# Patient Record
Sex: Male | Born: 1955 | Race: White | Hispanic: No | Marital: Married | Smoking: Current every day smoker
Health system: Southern US, Community
[De-identification: ages and names within clinical notes are randomized; demographics above are authoritative.]

## PROBLEM LIST (undated history)

## (undated) DIAGNOSIS — F329 Major depressive disorder, single episode, unspecified: Secondary | ICD-10-CM

## (undated) DIAGNOSIS — E119 Type 2 diabetes mellitus without complications: Secondary | ICD-10-CM

## (undated) DIAGNOSIS — G629 Polyneuropathy, unspecified: Secondary | ICD-10-CM

## (undated) DIAGNOSIS — E11621 Type 2 diabetes mellitus with foot ulcer: Secondary | ICD-10-CM

## (undated) DIAGNOSIS — F419 Anxiety disorder, unspecified: Secondary | ICD-10-CM

## (undated) DIAGNOSIS — M545 Low back pain: Secondary | ICD-10-CM

## (undated) DIAGNOSIS — F32A Depression, unspecified: Secondary | ICD-10-CM

## (undated) DIAGNOSIS — G8929 Other chronic pain: Secondary | ICD-10-CM

## (undated) DIAGNOSIS — M549 Dorsalgia, unspecified: Secondary | ICD-10-CM

## (undated) DIAGNOSIS — R3912 Poor urinary stream: Secondary | ICD-10-CM

## (undated) DIAGNOSIS — I10 Essential (primary) hypertension: Secondary | ICD-10-CM

## (undated) DIAGNOSIS — M1990 Unspecified osteoarthritis, unspecified site: Secondary | ICD-10-CM

## (undated) DIAGNOSIS — J449 Chronic obstructive pulmonary disease, unspecified: Secondary | ICD-10-CM

## (undated) DIAGNOSIS — K859 Acute pancreatitis without necrosis or infection, unspecified: Secondary | ICD-10-CM

## (undated) DIAGNOSIS — I739 Peripheral vascular disease, unspecified: Secondary | ICD-10-CM

## (undated) DIAGNOSIS — L97509 Non-pressure chronic ulcer of other part of unspecified foot with unspecified severity: Secondary | ICD-10-CM

## (undated) DIAGNOSIS — Z8709 Personal history of other diseases of the respiratory system: Secondary | ICD-10-CM

## (undated) DIAGNOSIS — G510 Bell's palsy: Secondary | ICD-10-CM

## (undated) DIAGNOSIS — Z8619 Personal history of other infectious and parasitic diseases: Secondary | ICD-10-CM

## (undated) DIAGNOSIS — G4700 Insomnia, unspecified: Secondary | ICD-10-CM

## (undated) DIAGNOSIS — J189 Pneumonia, unspecified organism: Secondary | ICD-10-CM

## (undated) HISTORY — PX: ESOPHAGOGASTRODUODENOSCOPY: SHX1529

## (undated) HISTORY — PX: MULTIPLE TOOTH EXTRACTIONS: SHX2053

## (undated) HISTORY — PX: CATARACT EXTRACTION W/ INTRAOCULAR LENS IMPLANT: SHX1309

## (undated) HISTORY — PX: BACK SURGERY: SHX140

## (undated) HISTORY — PX: TONSILLECTOMY: SUR1361

## (undated) HISTORY — PX: KNEE ARTHROSCOPY: SUR90

## (undated) HISTORY — PX: CHOLECYSTECTOMY: SHX55

---

## 1999-01-24 ENCOUNTER — Encounter: Payer: Self-pay | Admitting: Emergency Medicine

## 1999-01-26 ENCOUNTER — Encounter: Payer: Self-pay | Admitting: Internal Medicine

## 1999-01-29 ENCOUNTER — Encounter: Payer: Self-pay | Admitting: Internal Medicine

## 2000-09-23 ENCOUNTER — Ambulatory Visit (HOSPITAL_COMMUNITY): Admission: RE | Admit: 2000-09-23 | Discharge: 2000-09-23 | Payer: Self-pay | Admitting: *Deleted

## 2000-11-10 ENCOUNTER — Ambulatory Visit (HOSPITAL_BASED_OUTPATIENT_CLINIC_OR_DEPARTMENT_OTHER): Admission: RE | Admit: 2000-11-10 | Discharge: 2000-11-10 | Payer: Self-pay | Admitting: Urology

## 2003-03-06 ENCOUNTER — Ambulatory Visit (HOSPITAL_COMMUNITY): Admission: RE | Admit: 2003-03-06 | Discharge: 2003-03-06 | Payer: Self-pay | Admitting: Orthopedic Surgery

## 2003-03-06 ENCOUNTER — Ambulatory Visit (HOSPITAL_BASED_OUTPATIENT_CLINIC_OR_DEPARTMENT_OTHER): Admission: RE | Admit: 2003-03-06 | Discharge: 2003-03-06 | Payer: Self-pay | Admitting: Orthopedic Surgery

## 2003-06-14 HISTORY — PX: LUMBAR DISC SURGERY: SHX700

## 2003-06-19 ENCOUNTER — Ambulatory Visit (HOSPITAL_COMMUNITY): Admission: RE | Admit: 2003-06-19 | Discharge: 2003-06-20 | Payer: Self-pay | Admitting: Orthopaedic Surgery

## 2003-06-27 ENCOUNTER — Ambulatory Visit (HOSPITAL_COMMUNITY): Admission: RE | Admit: 2003-06-27 | Discharge: 2003-06-27 | Payer: Self-pay | Admitting: Orthopaedic Surgery

## 2003-08-14 HISTORY — PX: MICRODISCECTOMY LUMBAR: SUR864

## 2003-09-08 ENCOUNTER — Observation Stay (HOSPITAL_COMMUNITY): Admission: RE | Admit: 2003-09-08 | Discharge: 2003-09-09 | Payer: Self-pay | Admitting: Orthopaedic Surgery

## 2003-10-10 ENCOUNTER — Ambulatory Visit (HOSPITAL_COMMUNITY): Admission: RE | Admit: 2003-10-10 | Discharge: 2003-10-10 | Payer: Self-pay | Admitting: Orthopaedic Surgery

## 2004-08-15 ENCOUNTER — Ambulatory Visit: Payer: Self-pay | Admitting: Physical Medicine & Rehabilitation

## 2004-08-15 ENCOUNTER — Encounter
Admission: RE | Admit: 2004-08-15 | Discharge: 2004-11-13 | Payer: Self-pay | Admitting: Physical Medicine & Rehabilitation

## 2004-09-06 ENCOUNTER — Ambulatory Visit (HOSPITAL_COMMUNITY): Admission: RE | Admit: 2004-09-06 | Discharge: 2004-09-06 | Payer: Self-pay | Admitting: Orthopaedic Surgery

## 2004-10-15 ENCOUNTER — Ambulatory Visit: Payer: Self-pay | Admitting: Physical Medicine & Rehabilitation

## 2004-11-15 ENCOUNTER — Encounter
Admission: RE | Admit: 2004-11-15 | Discharge: 2005-02-13 | Payer: Self-pay | Admitting: Physical Medicine & Rehabilitation

## 2004-11-15 ENCOUNTER — Ambulatory Visit: Payer: Self-pay | Admitting: Physical Medicine & Rehabilitation

## 2005-01-16 ENCOUNTER — Ambulatory Visit: Payer: Self-pay | Admitting: Physical Medicine & Rehabilitation

## 2005-03-12 ENCOUNTER — Encounter
Admission: RE | Admit: 2005-03-12 | Discharge: 2005-06-10 | Payer: Self-pay | Admitting: Physical Medicine & Rehabilitation

## 2005-03-13 ENCOUNTER — Ambulatory Visit: Payer: Self-pay | Admitting: Physical Medicine & Rehabilitation

## 2005-04-23 ENCOUNTER — Ambulatory Visit: Payer: Self-pay | Admitting: Physical Medicine & Rehabilitation

## 2005-06-19 ENCOUNTER — Ambulatory Visit: Payer: Self-pay | Admitting: Physical Medicine & Rehabilitation

## 2005-06-19 ENCOUNTER — Encounter
Admission: RE | Admit: 2005-06-19 | Discharge: 2005-09-17 | Payer: Self-pay | Admitting: Physical Medicine & Rehabilitation

## 2005-08-08 ENCOUNTER — Ambulatory Visit: Payer: Self-pay | Admitting: Physical Medicine & Rehabilitation

## 2005-08-08 ENCOUNTER — Encounter
Admission: RE | Admit: 2005-08-08 | Discharge: 2005-11-06 | Payer: Self-pay | Admitting: Physical Medicine & Rehabilitation

## 2005-10-08 ENCOUNTER — Ambulatory Visit: Payer: Self-pay | Admitting: Physical Medicine & Rehabilitation

## 2005-11-27 ENCOUNTER — Encounter
Admission: RE | Admit: 2005-11-27 | Discharge: 2006-02-25 | Payer: Self-pay | Admitting: Physical Medicine & Rehabilitation

## 2005-11-27 ENCOUNTER — Ambulatory Visit: Payer: Self-pay | Admitting: Physical Medicine & Rehabilitation

## 2006-01-21 ENCOUNTER — Ambulatory Visit: Payer: Self-pay | Admitting: Physical Medicine & Rehabilitation

## 2006-03-18 ENCOUNTER — Encounter
Admission: RE | Admit: 2006-03-18 | Discharge: 2006-06-16 | Payer: Self-pay | Admitting: Physical Medicine & Rehabilitation

## 2006-05-07 ENCOUNTER — Ambulatory Visit: Payer: Self-pay | Admitting: Physical Medicine & Rehabilitation

## 2006-07-29 ENCOUNTER — Encounter
Admission: RE | Admit: 2006-07-29 | Discharge: 2006-07-30 | Payer: Self-pay | Admitting: Physical Medicine & Rehabilitation

## 2006-07-29 ENCOUNTER — Ambulatory Visit: Payer: Self-pay | Admitting: Physical Medicine & Rehabilitation

## 2006-10-21 ENCOUNTER — Ambulatory Visit: Payer: Self-pay | Admitting: Physical Medicine & Rehabilitation

## 2006-10-21 ENCOUNTER — Encounter
Admission: RE | Admit: 2006-10-21 | Discharge: 2006-10-22 | Payer: Self-pay | Admitting: Physical Medicine & Rehabilitation

## 2007-01-12 ENCOUNTER — Encounter
Admission: RE | Admit: 2007-01-12 | Discharge: 2007-03-25 | Payer: Self-pay | Admitting: Physical Medicine & Rehabilitation

## 2007-01-12 ENCOUNTER — Ambulatory Visit: Payer: Self-pay | Admitting: Physical Medicine & Rehabilitation

## 2007-04-07 ENCOUNTER — Ambulatory Visit: Payer: Self-pay | Admitting: Physical Medicine & Rehabilitation

## 2007-04-07 ENCOUNTER — Encounter
Admission: RE | Admit: 2007-04-07 | Discharge: 2007-04-09 | Payer: Self-pay | Admitting: Physical Medicine & Rehabilitation

## 2007-06-28 ENCOUNTER — Encounter
Admission: RE | Admit: 2007-06-28 | Discharge: 2007-07-22 | Payer: Self-pay | Admitting: Physical Medicine & Rehabilitation

## 2007-07-22 ENCOUNTER — Ambulatory Visit: Payer: Self-pay | Admitting: Physical Medicine & Rehabilitation

## 2007-12-22 ENCOUNTER — Encounter
Admission: RE | Admit: 2007-12-22 | Discharge: 2007-12-29 | Payer: Self-pay | Admitting: Physical Medicine & Rehabilitation

## 2007-12-29 ENCOUNTER — Ambulatory Visit: Payer: Self-pay | Admitting: Physical Medicine & Rehabilitation

## 2010-05-28 NOTE — Assessment & Plan Note (Signed)
Eddie Rasmussen returns to the clinic today for followup evaluation.  Eddie Rasmussen reports  that Eddie Rasmussen is doing well overall.  Eddie Rasmussen has adjusted his medicines, so that  Eddie Rasmussen is due for refill on the morphine sulfate and the hydrocodone today.  Eddie Rasmussen has sufficient supply of Soma and Neurontin.   REVIEW OF SYSTEMS:  Noncontributory.   MEDICATIONS:  1. Amitriptyline 100 mg p.o. nightly p.r.n.  2. Soma 350 mg t.i.d. p.r.n.  3. Lopid 600 mg b.i.d.  4. Hydrocodone 10/325 one tablet q.i.d. p.r.n.  5. Xanax 1 mg q.i.d.  6. Atenolol 25 mg two tablets daily.  7. Neurontin 300 mg two tablets t.i.d.  8. Levemir 30 units subcu q.12 h.  9. NovoLog insulin 10 units subcu b.i.d.  10.Morphine Sulfate Continuous Release 15 mg q.i.d.  11.Benadryl p.r.n.   PHYSICAL EXAMINATION:  GENERAL:  Well-appearing, large adult male in  mild-to-no acute discomfort.  VITAL SIGNS:  Blood pressure 149/84 with a pulse of 87, respiratory rate  18, and O2 saturation 96% on room air.  EXTREMITIES:  Eddie Rasmussen has 5/5 strength throughout.  Eddie Rasmussen ambulates without any  assistive device.   IMPRESSION:  1. Moderate-to-severe lumbar spondylosis/degenerative disk disease.  2. 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   In the office today, we did refill the patient's hydrocodone and  morphine sulfate continuous release each as of today.  No refill on the  Neurontin or Soma are necessary.  We will plan on seeing the patient in  followup in approximately 3-4 months' time with refills prior to that  appointment as necessary.           ______________________________  Ellwood Dense, M.D.     DC/MedQ  D:  07/22/2007 11:45:44  T:  07/23/2007 06:01:05  Job #:  [id number]

## 2010-05-28 NOTE — Assessment & Plan Note (Signed)
Mr. Centrella returns to the clinic today for followup evaluation.   He reports that is trying to remain as active as possible and has had  some weight loss intentionally.  He reports that his blood sugar still  remains in the 250-300 range despite using Levemir insulin and Humalog  insulin.  He does need a refill on his morphine sulfate and his  hydrocodone.  He generally uses the morphine sulfate 3-4 tablets  per  day and the hydrocodone approximately 4 per day.  He has a sufficient  supply of Neurontin and Soma at this time.   REVIEW OF SYSTEMS:  Positive for high blood sugar and poor appetite.   MEDICATIONS:  1. Amitriptyline 100 mg p.o. nightly p.r.n.  2. Soma 350 mg t.i.d. p.r.n.  3. Lopid 600 mg b.i.d.  4. Hydrocodone 10/325 one tablet q.i.d. p.r.n.  5. Xanax 1 mg q.i.d.  6. Atenolol 25 mg two tablets daily.  7. Neurontin 300 mg two tablets t.i.d.  8. Levemir 30 units subcutaneous q.12 h.  9. NovoLog 10 units subcutaneous  b.i.d.  10.Morphine sulfate continuous release 15 mg one tablet q.i.d. p.r.n.  11.Benadryl p.r.n.   PHYSICAL EXAMINATION:  GENERAL:  A well-appearing large adult male in  mild to no acute discomfort.  VITAL SIGNS:  Blood pressure 128/91 with pulse of 102, respiratory rate  18, and O2 saturation 99% on room air.  MUSCULOSKELETAL:  He has 5/5 strength throughout.  He ambulates without  any assistive device.   IMPRESSION:  1. Moderate to severe lumbar spondylosis/degenerative disk disease.  2. 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   In the office today we did refill the patient's morphine sulfate as of  April 14, 2007 and his hydrocodone as of April 21, 2007.  We will plan to  see the patient in followup in approximately 3 months' time.  I have  asked him to make sure he brings in prescription bottles, so we can get  accurate counts on his medicine and accurate dates for refills.           ______________________________  Ellwood Dense,  M.D.     DC/MedQ  D:  04/09/2007 13:27:01  T:  04/09/2007 14:17:00  Job #:  [id number]

## 2010-05-28 NOTE — Assessment & Plan Note (Signed)
[name] returns to clinic today for followup evaluation.  He reports  that overall he is doing well.  He continues to get benefit from a  combination of the Soma, hydrocodone, morphine sulfate, and Neurontin.  He has a sufficient supply of all but the morphine sulfate at this point  and he needs a refill on that in the office today.   The patient reports that his recent hemoglobin A1c was elevated at 11.0.  He reports that he was discontinued on his 70/30 insulin and started on  Levemir at 20 units q.a.m. and Humalog with his evening meal.  He is due  to follow up with his diabetic physician January 28, 2007.  He was also  recently started on Glucotrol daily.   REVIEW OF SYSTEMS:  Noncontributory.   MEDICATIONS:  1. Amitriptyline 100 mg p.o. q.h.s. p.r.n.  2. Soma 350 mg t.i.d. (3 to 4 per day).  3. Lopid 600 mg b.i.d.  4. Hydrocodone 10/325 one tablet 3 to 4 times per day p.r.n.  5. Xanax 1 mg q.i.d.  6. Atenolol 25 mg 2 tablets daily.  7. Neurontin 300 mg 2 tablets t.i.d.  8. Morphine sulfate continuous release 50 mg   Dictation ended at this point.           ______________________________  Ellwood Dense, M.D.     DC/MedQ  D:  01/15/2007 14:12:24  T:  01/15/2007 15:41:23  Job #:  [id number]

## 2010-05-28 NOTE — Assessment & Plan Note (Signed)
Mr. Reaume returns for followup evaluation. He continues to get reasonable  relief from his combination of his Soma, hydrocodone, morphine sulfate  continuous release and Neurontin. He reports that his HbA1c was recently  elevated at 11. He was discontinued from his 70/30 insulin and started  on Levemir 20 units subcu q.a.m. and Humalog sliding scale with dinner.  He reports that he is due to follow up with his diabetes specialist  January 28, 2007. The patient does need a refill on his morphine sulfate  in the office today, but has a sufficient supply of Soma, hydrocodone  and Neurontin.   REVIEW OF SYSTEMS:  Noncontributory.   MEDICATIONS:  1. Amitriptyline 100 mg p.o. at bedtime p.r.n.  2. Soma 350 mg t.i.d. p.r.n.  3. Lopid 600 mg b.i.d.  4. Hydrocodone 10/325 one tablet 3-4 times per day p.r.n.  5. Xanax 1 mg q.i.d.  6. Atenolol 25 mg two tablets every day.  7. Neurontin 300 mg two tablets t.i.d.  8. Levemir 20 units subcu q.a.m.  9. Humalog insulin sliding scale with dinner.  10.Morphine sulfate continuous release 15 mg one tablet q.i.d. p.r.n.      (3-4 per day).  11.Benadryl p.r.n.   PHYSICAL EXAMINATION:  Well-appearing moderately overweight adult large  male in mild to no acute discomfort. Blood pressure 149/89 with a pulse  of 99, respiratory rate 18, O2 saturation 95% on room air. The patient  has 5/5 strength throughout. He ambulates without any assistive device.   IMPRESSION:  1. Moderate-to-severe lumbar spondylosis/degenerative disk disease.  2. 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   In the office today we did refill the patient's morphine sulfate  continuous release one tablet q.i.d. p.r.n. a total of 120 at 15 mg  strength. No refill on the other medicines is necessary at this time. We  will plan on seeing the patient in follow up in approximately 3 months  time with refills as necessary prior to that appointment.     ______________________________  Ellwood Dense, M.D.     DC/MedQ  D:  01/15/2007 14:38:08  T:  01/15/2007 17:07:04  Job #:  [id number]

## 2010-05-28 NOTE — Assessment & Plan Note (Signed)
Mr. Jaime returns to the clinic today for follow-up evaluation.  He  reports that he is getting good relief from his morphine sulfate along  with his Neurontin and hydrocodone.  He does need a refill on his  hydrocodone but has a sufficient supply of morphine and Neurontin.  He  generally uses his morphine as little as three times a day with only  rare use of the medicine four times per day.   The patient has been more active recently.  He has moved out of a prior  trailer that he was living in and has moved into a new one that is  handicap-accessible.  He has been doing some extra work related to that  move.  He reports that his hemoglobin A1C was measured at 11.  He  reports that he has had adjustments in his blood sugar medicines and has  had an improvement with recent readings of CBGs of 130-150.   MEDICATIONS:  1. Amitriptyline 100 mg nightly p.r.n.  2. Soma 350 mg t.i.d. p.r.n.  3. Lopid 600 mg b.i.d.  4. Hydrocodone 10/325 1 tablet q.i.d. p.r.n.  5. Xanax 1 mg q.i.d.  6. Atenolol 25 mg 2 tablets daily.  7. Neurontin 300 mg 2 tablets t.i.d.  8. Insulin 70/30 110 units q.a.m. and 90 units q.p.m.  9. Morphine sulfate continuous release 50 mg q.i.d. (3-4 per day).  10.Benadryl p.r.n.   REVIEW OF SYSTEMS:  Noncontributory.   PHYSICAL EXAMINATION:  GENERAL:  A well-appearing, large, well-nourished  adult male in mild to no acute discomfort.  VITAL SIGNS:  Blood pressure 135/96, pulse 95, respiratory rate 18, O2  saturation 97% on room air.  NEUROMUSCULAR:  He has 5/5 strength throughout.   IMPRESSION:  1. Moderate-to-severe lumbar spondylosis/degenerative disk disease.  2. 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   In the office today, we did refill the patient's hydrocodone as of July  21.  No other refills are necessary at this time.  We will plan on  seeing him in followup in three months time with refills prior to that  appointment as necessary.     ______________________________  Ellwood Dense, M.D.     DC/MedQ  D:  07/30/2006 10:02:02  T:  07/30/2006 19:36:16  Job #:  [id number]

## 2010-05-28 NOTE — Assessment & Plan Note (Signed)
Eddie Rasmussen returns to clinic today for followup evaluation.  He reports  that he is doing well on a combination of his Soma, hydrocodone, and  morphine.  He does need a refill on each of those at the beginning of  the month.  He reports that he has lot of stress in his family lately  between his sister-in-law and his mother.  He is trying to do as best as  he can to be the mediator.   REVIEW OF SYSTEMS:  Positive for skin rash, weight gain, fever and  chills, high blood sugar, urinary retention, and limb swelling.   MEDICATIONS:  1. Amitriptyline 100 mg p.o. at bedtime, p.r.n.  2. Soma 350 mg t.i.d. p.r.n.  3. Lopid 600 mg b.i.d.  4. Hydrocodone 10/325, one tablet q.i.d. p.r.n.  5. Xanax 1 mg q.i.d.  6. Atenolol 25 mg 2 tablets daily.  7. Neurontin 300 mg 2 tablets t.i.d.  8. Levemir 13 units subcu q.12 h.  9. NovoLog insulin 10 units subcu b.i.d.  10.Morphine sulfate continuous release 15 mg q.i.d.  11.Benadryl p.r.n.   PHYSICAL EXAMINATION:  GENERAL:  Well-appearing large adult male in mild-  to-no acute discomfort.  VITAL SIGNS:  Blood pressure was 168/103 in his left upper extremity and  151/89 in his right upper extremity.  Pulse is 100 with respiratory rate  of 18 and O2 saturation 95% on room air.  MUSCULOSKELETAL:  He ambulates without any assistive device and has 5/5  strength throughout.  Lumbar range of motion was decreased in flexion  and extension.   IMPRESSION:  1. Moderate-to-severe lumbar spondylosis/degenerative disk disease.  2. 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   In the office today, we did refill the patient's Soma, hydrocodone, and  morphine each as of January 15, 2008.  We will plan on seeing the patient  in followup in this office in approximately 4 months time with refills  prior to that appointment.  He continues to get good analgesic affect  without signs of diversion or significant side effects.     ______________________________  Ellwood Dense, M.D.     DC/MedQ  D:  12/29/2007 10:53:36  T:  12/30/2007 16:10:96  Job #:  [id number]

## 2010-05-28 NOTE — Assessment & Plan Note (Signed)
HISTORY:  [name] returns to clinic today for followup evaluation.  He  reports that he is doing well overall.  Continues to get good relief  from a combination of his morphine, Neurontin, hydrocodone and Soma with  amitriptyline as needed.  He does need refill on the hydrocodone and  Soma in the office today.  The patient reports that he is more active  recently.  He has changed local homes recently and that took some extra  effort to move all his possessions.   REVIEW OF SYSTEMS:  Noncontributory.   MEDICATIONS:  1. Amitriptyline 100 mg p.o. nightly p.r.n.  2. Soma 350 mg t.i.d., p.r.n.  3. Lopid 600 mg b.i.d.  4. Hydrocodone 10/325 one tablet 4 times daily p.r.n.  5. Xanax 1 mg 4 times daily.  6. Atenolol 25 mg 2 tablets daily.  7. Neurontin 300 mg 2 tablets t.i.d.  8. Insulin 70/30 110 units q.a.m. and 90 units q.p.m.  9. Morphine sulfate continuous release 50 mg 4 times daily (3-4 per      day).  10.Benadryl p.r.n.   PHYSICAL EXAMINATION:  GENERAL:  Well-appearing, large, well-nourished  adult male in mild to no acute discomfort.  VITAL SIGNS:  Blood pressure is 142/84 with pulse of 92, respiratory  rate 18 and O2 saturation 96% on room air.  NEUROMUSCULAR:  He has 5/5 strength throughout.  He ambulates without  any assistive device.  Lumbar range of motion was decreased in all  planes.   IMPRESSION:  1. Moderate to severe lumbar spondylosis/degenerative disk disease.  2. 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   PLAN:  In the office today we did refill the patient's Soma and  hydrocodone as of 10/30/2006.  No other refills are necessary at this  time.  Will plan on seeing him in followup in approximately 3 months'  time with refills prior to that appointment as necessary.           ______________________________  Ellwood Dense, M.D.     DC/MedQ  D:  10/22/2006 10:30:15  T:  10/22/2006 14:37:28  Job #:  [id number]

## 2010-05-31 NOTE — Group Therapy Note (Signed)
Unfortunately, I had just started a dictation and cut it off early by  pushing the wrong button.  This is a new office note for August 19, 2004, for  Eddie Rasmussen, his medical record number is [id number], and if you could please  pick it up from where I left off.   The patient reports that in September 2004, he underwent a right knee  meniscectomy by Dr. Marlinda Mike.  Unfortunately, the patient reports that that  surgery gave him no relief.   January 11, 2003, the patient was evaluated by Dr. Ophelia Charter, a local  orthopedist.  The MRI scan was reviewed.  Dr. Ophelia Charter felt the patient was an  unfavorable surgical candidate.  He recommended epidural steroid injections.   January 15, 2003, the patient was started with Neurontin along with Vicodin  and Soma for pain management.   January 2005, the patient underwent his first epidural steroid injection,  which he reports helped a great deal.   March 06, 2003, the patient saw Dr. Turner Daniels and underwent arthroscopy of  the right knee for partial medial meniscectomy and excision of focal grade 4  chondromalacia.  He also had a medial tibial condyle along with some minimal  chondromalacia removed from the patella.  The patient reports that that  surgery also gave him no relief in terms of his right knee pain.   The patient underwent his second epidural steroid injection February 2005  and reports that that also gave him some relief.   March 29, 2003, the patient saw Dr. Turner Daniels in follow-up. His pain was much  better in the right knee.  He was allowed light-duty sedentary work at that  time.   March 23, 2003, the patient underwent his third epidural steroid injection,  which reportedly gave him some relief.   March 2005, the patient was referred for therapy.   April 22, 2003, the patient underwent an MRI scan of his lumbar spine, which  showed moderately severe central canal impingement and biforaminal stenosis  at L3-4, related to central protrusion  superimposed upon advanced lumbar  spondylosis.  There was a central L4-5 disk extrusion with extension toward  the right L5 nerve root, which is superimposed upon advanced spondylosis  resulting in moderate to severe central canal impingement and advanced  biforaminal stenosis.  There was distortion of the thecal sac centrally and  at the descending right L5 nerve root.  There was broad-based left  paracentral and foraminal disk protrusion and a spur at L5-S1.  This  flattened the thecal sac and posteriorly displaced the left S1 nerve root.  A more generalized spondylosis at this level contributed to advanced  biforaminal stenosis.   May 03, 2003, EMG and nerve conduction studies reportedly were consistent  with L5 radiculopathy.   On May 17, 2003, Dr. Ophelia Charter discussed microdiskectomy.   June 19, 2003, the patient underwent L4-5 microdiskectomy performed by Dr.  Ophelia Charter.  He reports that he got no better but actually got worse after that  surgery.   June 2005, the patient was involved in outpatient physical therapy.   September 05, 2003, Dr. Ophelia Charter saw the patient in follow-up with persistent  right lower extremity pain.  He planned a bilateral L4-5 microdiskectomy and  decompression after follow-up MRI scan, which showed moderately large  central disk protrusion.   September 11, 2003, the patient underwent repeat microdiskectomy for recurrent  herniated nucleus pulposus bilaterally at L4-5.  The patient reports that  this helped him with his leg  pain for approximately one month's time.   The patient was involved in outpatient physical therapy between October 2005  and the end of October 2005.  He withdrew from therapy after approximately  two visits.   The patient reports that he subsequently was tried on OxyContin and  oxycodone but that made him combative and argumentative and gave him  recurrent nightmares.  That subsequently was stopped.   October 31, 2003, the patient saw Dr.  Ophelia Charter and was started on methadone 10  mg b.i.d.  He was also started on Zoloft at that time.   November 15, 2003, the patient underwent a right knee injection with Marcaine  and Xylocaine performed by Dr. Turner Daniels.   November 25, 2003, an MRI scan of his right knee showed some full-thickness  cartilage along the innermost medial compartment.  A discrete meniscal tear  could not be confirmed.   December 20, 2003, the patient saw Dr. Ophelia Charter in follow-up and methadone was  continued at 10 mg q.12h.  He was allowed light-duty work, four hours per  day with no lifting greater than 20 pounds and no ladder climbing.   February 06, 2004, the patient saw Dr. Turner Daniels.  At that time Dr. Turner Daniels felt  that his right knee was at maximal medial improvement and rated him as 20%  impairment for the right lower extremity.  He was allowed no squatting or  stooping and no lifting greater than 20 pounds.  Follow-up was on an as-  needed basis.   February 02, 2004, the patient saw Dr. Ophelia Charter and rated him at 18% for his  lumbar spine impairment.  He was judged to be at maximal medical improvement  at that time, and follow-up was on an as-needed basis.   On Jun 05, 2004, the patient saw Dr. Ophelia Charter and was prescribed Vicodin along  with Neurontin and Soma.  Some of those medication were being switched over  to Dr. Eloise Harman, his primary care physician.   Jun 05, 2004, the patient saw Dr. Turner Daniels in follow-up.  At that time he was  maintained on a 20-pound lifting limit with no stooping, bending, squatting  or crawling, kneeling or climbing.  He was asked to try Celebrex.   The patient reports that he had seen Dr. Noel Gerold, a local orthopedist, for at  least one visit with an MRI scan having been completed.  I do not have  records regarding those visits nor from the MRI scan.  Dr. Noel Gerold suggested  that a fusion of his lumbar spine may be appropriate, but he wished to have the right knee problem completely evaluated by Dr.  Turner Daniels before any surgery  on his back would be done.  The patient plans to follow up with Dr. Noel Gerold  and feels that surgery is still a possibility, although the patient is  reluctant given the poor success that he has had with the four surgeries,  two involving his knee and two involving his back, thus far.   Presently the patient complains of right knee pain which is most severe on  the medial surface of his right knee along with the lower patella  anteriorly.  He reports that he also has right lumbar pain radiating to his  right hip and down the right thigh, stopping at his right ankle.  He reports  some milder pain in his left lumbar region without radicular symptoms,  although it does move into his buttocks.  He reports that a recent  hemoglobin  A1c came back at 7.3.  He also complains of some right upper arm  pain with decreased range of motion but reports that that was denied by  Workers Comp to date.   MEDICATIONS:  1. Amitriptyline 50 mg one tablet q.h.s.  2. Soma 350 mg one tablet q.i.d.  3. Zoloft 50 mg daily.  4. Lopid 600 mg one tablet twice a day.  5. Hydrocodone 5/500 mg two tablets three times a day.  6. Xanax 1 mg one tablet q.i.d.  7. Atenolol 25 mg one tablet daily.  8. Neurontin 300 mg two tablets three times per day.  9. Insulin 70/30 110 q.a.m. and 100 units q.p.m.   At the present time, the patient reports that he was getting better relief  from his methadone than he is from his present Vicodin.  He is not sure how  much benefit he gets from the amitriptyline, Soma or Neurontin medication at  this time.   FAMILY HISTORY:  Positive for diabetes mellitus and heart disease.   ALLERGIES:  Intolerance to NSAIDs, prednisone and codeine.   SOCIAL HISTORY:  The patient was married but is separated for the past 10  days.  He smokes 1-1/2 packs of cigarettes per day for 30 years.  He denies  alcohol intake.  He previously worked as a Education administrator.  He is on Workers Comp   payments at the present time.  He lives on his own at this point with his  mother living close by.   PAST MEDICAL HISTORY:  1. History of knee surgery x2 by Dr. Marlinda Mike and Dr. Turner Daniels as noted.  2. History of back surgery x2 by Dr. Ophelia Charter.  3. Insulin-dependent diabetes mellitus since 1996.  4. Prior cholecystectomy in 2001.  5. Adult circumcision.  6. History of pancreatitis/pneumonia.  7. Bronchitis.  8. Arthritis.   PHYSICAL EXAMINATION:  GENERAL:  An ill-kempt, large adult male in moderate  acute discomfort.  He is pleasant and cooperative throughout the  examination.  VITAL SIGNS:  Blood pressure 142/97 with a pulse of 97, respiratory rate 16,  and O2 saturation 97% on room air.  NEUROLOGIC/MUSCULOSKELETAL:  The patient was able to ambulate without any  assistive device in a slow manner with obvious pain.  He was unable to toe-  walk and heel-walk on the right side.  Upper extremity range of motion was normal on the left side but decreased in terms of shoulder flexion and  abduction on the right.  Elbow range of motion was normal bilaterally, as  was distal range of motion of the wrists and fingers.  Upper extremity exam  generally showed 5/5 strength throughout the left upper extremity and 5-/5  strength throughout the proximal right upper extremity and otherwise 5/5  strength throughout the right upper extremity distally.  Bulk and tone were  normal, and reflexes were 2+ and symmetrical.  Sensation was intact to light  touch throughout the bilateral upper extremities.  Lower extremity exam  showed at least 4+ to 5-/5 strength in hip flexion and knee extension along  with ankle dorsiflexion bilaterally.  Reflexes reveal 1+ at the bilateral  knees and ankles, and sensation was intact to light touch throughout the  bilateral lower extremities with the exception of his right lateral calf and  into the fourth and fifth digits of his right foot.  Lumbar range of motion  showed  significant decreased mobility in the standing position.  In the  supine position, hip range of motion  was within normal limits and straight  leg raise was mildly positive at only 20-30 degrees.   IMPRESSION:  1. Moderate to severe lumbar spondylosis/degenerative disk disease.  2. 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   At the present time, the patient has not responded to a surgery  arthroscopically done of his right knee x2, nor to lumbar microdiskectomy  done x2.  At this point there is no planned surgery, but there is still a  possibility though the likelihood is not as good judging from Dr. Ophelia Charter' and  Dr. Wadie Lessen notes.  It is not clear exactly if Dr. Noel Gerold feels strongly  about the back surgery, although he has apparently recommended a fusion to  the patient.  The patient is somewhat reluctant given his poor results in  the past.   In any event, at this point the Vicodin that he is using is not giving him  adequate pain relief.  Instead, will resume the methadone at 10 mg one  tablet q.12h.  I have told him that I will adjust that dose as necessary  when I see him in follow-up in approximately three to four weeks' time.  I  have recommended he continue the amitriptyline, Soma and Neurontin  medications, and I will be refilling those as necessary in the future.  We  did give him a new prescription for methadone 10 mg one tablet q.12h., to  start as of August 21, 2004, since he has problems with transportation.  Will  also start giving him the amitriptyline at 50 mg one tablet p.o. q.h.s.  He  will call in for refills on the Neurontin and Soma as those run out.  Will  plan on seeing the patient in follow-up in this office in approximately  three to four weeks' time.       DC/MedQ  D:  08/19/2004 14:39:23  T:  08/20/2004 08:10:32  Job #:  [id number]   cc:   Mendel Corning, RN, BSN, Case Mgr.  [employer] research (medical), [employer].  [address], [address] [place], [place] [address]

## 2010-05-31 NOTE — Op Note (Signed)
NAME:  Eddie Rasmussen, Eddie Rasmussen                             ACCOUNT NO.:  [id number]   MEDICAL RECORD NO.:  [id number]                   PATIENT TYPE:  INP   LOCATION:  5030                                 FACILITY:  [facility]   PHYSICIAN:  Mark C. Ophelia Charter, M.D.                 DATE OF BIRTH:  30-Mar-1955   DATE OF PROCEDURE:  09/08/2003  DATE OF DISCHARGE:  09/09/2003                                 OPERATIVE REPORT   PREOPERATIVE DIAGNOSES:  Recurrent L4-5 herniated nucleus pulposus, left L5-  S1 herniated nucleus pulposus.   POSTOPERATIVE DIAGNOSES:  Recurrent L4-5 herniated nucleus pulposus, left L5-  S1 herniated nucleus pulposus.   PROCEDURE:  Repeat bilateral L4-5 microdiskectomy, removal of herniated  nucleus pulposus, left L5-S1 microdiskectomy.   SURGEON:  Mark C. Ophelia Charter, M.D.   ASSISTANT:  F.R.N.A.   ANESTHESIA:  GOT.   ESTIMATED BLOOD LOSS:  100 cc.   This 55 year old male had a recurrent disk at L4-5 and the plan was for  bilateral approach.  He had had only right leg pain preoperatively until he  was seen in the holding area and stated in the last 2-3 days, he has been  having significant left leg pain which was a new finding.  He does have a  known L5-S1 HNP and the concern was that his leg pain on the left could be  due to the HNP on either L4-5 or L5-S1.  It was decided to take a look at  the L5-S1 disk as well intraoperatively.   After induction of general anesthesia, preoperative antibiotic prophylaxis,  the patient was placed in the kneeling position, careful padding and  positioning.  The back was prepped with Duraprep.  The area was scored with  towels.  Betadine and Vi-drape was applied and laminectomy sheets and  drapes.  The incision was made, opening the old incision, extending it  distally.  Subperiosteal dissection down the lamina and a Kocher clamp was  placed, taking the x-rays showing that it was just off from the planned L4-5  level.  The laminotomy on the right  was identified at L4-5.  This was  enlarged, removing more normal bone, going up normal dura proximally and  then taking a top third of the right L5 lamina, performing a laminotomy at  L5, down a normal dura.  With normal dura above and below, there was  extensive deep fibrous scar tissue present in the lateral gutter.  With  picks, chunks of ligament were removed and scar tissue with the operative  microscope was gently dissected off using a combination of the black nerve  hook, the titanium microdissection curet's, 15 scalpel blade.  The dural  separator was used to separate the plane.  In one small area, there was a  small bubble.  No true dural leak and this was approximately on the right  side well above the  disk.  The patty was used to protect it.  Valsalva was  checked.  There was no spinal fluid leak and the bubble was 2 mm in size.  The nerve root was gently teased off the large disk and the annulus was  incised with a 15 scalpel blade.  Multiple chunks of disk material were  removed continuing until the foramina was enlarged.  Disk herniation was  primarily right at the level of the disk.  It was extremely large and  multiple large chunks were removed from the midline.  At this point, I  switched sides to the opposite side and then performed a microdissection on  the left side at L4-5, removing additional large chunks of disk on the left  side, freeing up the nerve root, performing a foraminotomy and removing bone  out to the side, even with the level of the pedicle.  Passes were made until  a hockey stick could be placed to enter the dura, visualizing the opposite  side, passed the entry of the dura.  A 180 degree sweep showed no area of  compression and all chunks of disk were removed at 4-5.  Next, the L5-S1  disk space was exposed, incising the ligamentum, removing chunks of disk  laterally, mobilizing the nerve root.  There was a firm portion of the disk  that was partially  calcified.  The area was excised and immediately  underneath it was several large fragments which were just below the  posterior longitudinal ligament and appeared fresh.  There were no free  fragments present.  The foramina was enlarged and once chunks of disk had  been removed, an Epstein curet was used to push down and remove some  additional degenerative chunks.  The nerve root was completely freed.  The  foramina was checked.  Palpation at all sites of the nerve root.  Irrigation  with saline solution.  Above the L5 and L4-5 level on both sides, Valsalva  repeated.  Re-inspection of the dura and no spinal fluid leak.  Passes were  made with the hockey stick again around the nerve root above and below on  the right as well as the left around the L5 nerve root on the left and the  L4 nerve root, right and left.  Irrigation with saline solution and closure  of the deep fascia with 0 Vicryl, 2-0 Vicryl and the subcutaneous tissue.  Marcaine infiltration, skin and staple closure.  Postoperative dressing.                                               Mark C. Ophelia Charter, M.D.    MCY/MEDQ  D:  09/08/2003  T:  09/10/2003  Job:  [id number]

## 2012-03-08 ENCOUNTER — Encounter (HOSPITAL_BASED_OUTPATIENT_CLINIC_OR_DEPARTMENT_OTHER): Payer: Self-pay

## 2013-05-25 ENCOUNTER — Inpatient Hospital Stay (HOSPITAL_COMMUNITY)
Admission: AD | Admit: 2013-05-25 | Discharge: 2013-05-29 | DRG: 241 | Disposition: A | Discharge status: Home / Self Care | Payer: Medicare Other | Source: Ambulatory Visit | Attending: Internal Medicine | Admitting: Internal Medicine

## 2013-05-25 ENCOUNTER — Encounter (HOSPITAL_COMMUNITY): Payer: Self-pay | Admitting: Internal Medicine

## 2013-05-25 ENCOUNTER — Inpatient Hospital Stay (HOSPITAL_COMMUNITY): Payer: Medicare Other

## 2013-05-25 DIAGNOSIS — L02619 Cutaneous abscess of unspecified foot: Secondary | ICD-10-CM | POA: Diagnosis present

## 2013-05-25 DIAGNOSIS — Z720 Tobacco use: Secondary | ICD-10-CM | POA: Diagnosis present

## 2013-05-25 DIAGNOSIS — F17200 Nicotine dependence, unspecified, uncomplicated: Secondary | ICD-10-CM | POA: Diagnosis present

## 2013-05-25 DIAGNOSIS — E1169 Type 2 diabetes mellitus with other specified complication: Secondary | ICD-10-CM

## 2013-05-25 DIAGNOSIS — M129 Arthropathy, unspecified: Secondary | ICD-10-CM | POA: Diagnosis present

## 2013-05-25 DIAGNOSIS — M9080 Osteopathy in diseases classified elsewhere, unspecified site: Secondary | ICD-10-CM | POA: Diagnosis present

## 2013-05-25 DIAGNOSIS — IMO0002 Reserved for concepts with insufficient information to code with codable children: Secondary | ICD-10-CM | POA: Diagnosis present

## 2013-05-25 DIAGNOSIS — Z9119 Patient's noncompliance with other medical treatment and regimen: Secondary | ICD-10-CM

## 2013-05-25 DIAGNOSIS — F329 Major depressive disorder, single episode, unspecified: Secondary | ICD-10-CM | POA: Diagnosis present

## 2013-05-25 DIAGNOSIS — J449 Chronic obstructive pulmonary disease, unspecified: Secondary | ICD-10-CM | POA: Diagnosis present

## 2013-05-25 DIAGNOSIS — G8929 Other chronic pain: Secondary | ICD-10-CM | POA: Diagnosis present

## 2013-05-25 DIAGNOSIS — M545 Low back pain, unspecified: Secondary | ICD-10-CM | POA: Diagnosis present

## 2013-05-25 DIAGNOSIS — E1149 Type 2 diabetes mellitus with other diabetic neurological complication: Secondary | ICD-10-CM | POA: Diagnosis present

## 2013-05-25 DIAGNOSIS — F411 Generalized anxiety disorder: Secondary | ICD-10-CM | POA: Diagnosis present

## 2013-05-25 DIAGNOSIS — I739 Peripheral vascular disease, unspecified: Secondary | ICD-10-CM | POA: Diagnosis present

## 2013-05-25 DIAGNOSIS — E1159 Type 2 diabetes mellitus with other circulatory complications: Principal | ICD-10-CM | POA: Diagnosis present

## 2013-05-25 DIAGNOSIS — F32A Depression, unspecified: Secondary | ICD-10-CM | POA: Diagnosis present

## 2013-05-25 DIAGNOSIS — I10 Essential (primary) hypertension: Secondary | ICD-10-CM | POA: Diagnosis present

## 2013-05-25 DIAGNOSIS — L98499 Non-pressure chronic ulcer of skin of other sites with unspecified severity: Secondary | ICD-10-CM | POA: Diagnosis present

## 2013-05-25 DIAGNOSIS — Z91199 Patient's noncompliance with other medical treatment and regimen due to unspecified reason: Secondary | ICD-10-CM

## 2013-05-25 DIAGNOSIS — E785 Hyperlipidemia, unspecified: Secondary | ICD-10-CM | POA: Diagnosis present

## 2013-05-25 DIAGNOSIS — L03039 Cellulitis of unspecified toe: Secondary | ICD-10-CM | POA: Diagnosis present

## 2013-05-25 DIAGNOSIS — L97529 Non-pressure chronic ulcer of other part of left foot with unspecified severity: Secondary | ICD-10-CM | POA: Diagnosis present

## 2013-05-25 DIAGNOSIS — Z886 Allergy status to analgesic agent status: Secondary | ICD-10-CM

## 2013-05-25 DIAGNOSIS — Z794 Long term (current) use of insulin: Secondary | ICD-10-CM

## 2013-05-25 DIAGNOSIS — I798 Other disorders of arteries, arterioles and capillaries in diseases classified elsewhere: Secondary | ICD-10-CM | POA: Diagnosis present

## 2013-05-25 DIAGNOSIS — E1142 Type 2 diabetes mellitus with diabetic polyneuropathy: Secondary | ICD-10-CM | POA: Diagnosis present

## 2013-05-25 DIAGNOSIS — J4489 Other specified chronic obstructive pulmonary disease: Secondary | ICD-10-CM | POA: Diagnosis present

## 2013-05-25 DIAGNOSIS — F3289 Other specified depressive episodes: Secondary | ICD-10-CM | POA: Diagnosis present

## 2013-05-25 DIAGNOSIS — E1165 Type 2 diabetes mellitus with hyperglycemia: Secondary | ICD-10-CM | POA: Diagnosis present

## 2013-05-25 DIAGNOSIS — Z6833 Body mass index (BMI) 33.0-33.9, adult: Secondary | ICD-10-CM

## 2013-05-25 HISTORY — DX: Depression, unspecified: F32.A

## 2013-05-25 HISTORY — DX: Anxiety disorder, unspecified: F41.9

## 2013-05-25 HISTORY — DX: Low back pain: M54.5

## 2013-05-25 HISTORY — DX: Polyneuropathy, unspecified: G62.9

## 2013-05-25 HISTORY — DX: Bell's palsy: G51.0

## 2013-05-25 HISTORY — DX: Essential (primary) hypertension: I10

## 2013-05-25 HISTORY — DX: Non-pressure chronic ulcer of other part of unspecified foot with unspecified severity: L97.509

## 2013-05-25 HISTORY — DX: Major depressive disorder, single episode, unspecified: F32.9

## 2013-05-25 HISTORY — DX: Other chronic pain: G89.29

## 2013-05-25 HISTORY — DX: Chronic obstructive pulmonary disease, unspecified: J44.9

## 2013-05-25 HISTORY — DX: Type 2 diabetes mellitus with foot ulcer: E11.621

## 2013-05-25 HISTORY — DX: Unspecified osteoarthritis, unspecified site: M19.90

## 2013-05-25 HISTORY — DX: Peripheral vascular disease, unspecified: I73.9

## 2013-05-25 LAB — COMPREHENSIVE METABOLIC PANEL
ALT: 9 U/L (ref 0–53)
AST: 12 U/L (ref 0–37)
Albumin: 3.3 g/dL — ABNORMAL LOW (ref 3.5–5.2)
Alkaline Phosphatase: 101 U/L (ref 39–117)
BILIRUBIN TOTAL: 0.2 mg/dL — AB (ref 0.3–1.2)
BUN: 15 mg/dL (ref 6–23)
CALCIUM: 9 mg/dL (ref 8.4–10.5)
CO2: 27 mEq/L (ref 19–32)
CREATININE: 1 mg/dL (ref 0.50–1.35)
Chloride: 99 mEq/L (ref 96–112)
GFR, EST AFRICAN AMERICAN: 71 mL/min — AB (ref >90)
GFR, EST NON AFRICAN AMERICAN: 61 mL/min — AB (ref >90)
Glucose, Bld: 250 mg/dL — ABNORMAL HIGH (ref 70–99)
POTASSIUM: 4.9 meq/L (ref 3.7–5.3)
Sodium: 137 mEq/L (ref 137–147)
Total Protein: 6.8 g/dL (ref 6.0–8.3)

## 2013-05-25 LAB — URINALYSIS, ROUTINE W REFLEX MICROSCOPIC
Glucose, UA: NEGATIVE mg/dL
HGB URINE DIPSTICK: NEGATIVE
Ketones, ur: 15 mg/dL — AB
Nitrite: NEGATIVE
PROTEIN: NEGATIVE mg/dL
Specific Gravity, Urine: 1.024 (ref 1.005–1.030)
Urobilinogen, UA: 1 mg/dL (ref 0.0–1.0)
pH: 5 (ref 5.0–8.0)

## 2013-05-25 LAB — CBC
HCT: 36.7 % — ABNORMAL LOW (ref 39.0–52.0)
Hemoglobin: 12.4 g/dL — ABNORMAL LOW (ref 13.0–17.0)
MCH: 28.1 pg (ref 26.0–34.0)
MCHC: 33.8 g/dL (ref 30.0–36.0)
MCV: 83.2 fL (ref 78.0–100.0)
Platelets: 234 10*3/uL (ref 150–400)
RBC: 4.41 MIL/uL (ref 4.22–5.81)
RDW: 13.4 % (ref 11.5–15.5)
WBC: 8.5 10*3/uL (ref 4.0–10.5)

## 2013-05-25 LAB — GLUCOSE, CAPILLARY
GLUCOSE-CAPILLARY: 135 mg/dL — AB (ref 70–99)
Glucose-Capillary: 154 mg/dL — ABNORMAL HIGH (ref 70–99)

## 2013-05-25 LAB — URINE MICROSCOPIC-ADD ON

## 2013-05-25 LAB — HEMOGLOBIN A1C
Hgb A1c MFr Bld: 8.8 % — ABNORMAL HIGH (ref <5.7)
MEAN PLASMA GLUCOSE: 206 mg/dL — AB (ref <117)

## 2013-05-25 LAB — TSH: TSH: 3.37 u[IU]/mL (ref 0.350–4.500)

## 2013-05-25 MED ORDER — AMITRIPTYLINE HCL 100 MG PO TABS
100.0000 mg | ORAL_TABLET | Freq: Every day | ORAL | Status: DC
  Administered 2013-05-25 – 2013-05-28 (×4): 100 mg via ORAL
  Filled 2013-05-25 (×5): qty 1

## 2013-05-25 MED ORDER — PIPERACILLIN-TAZOBACTAM 3.375 G IVPB 30 MIN
3.3750 g | Freq: Three times a day (TID) | INTRAVENOUS | Status: DC
  Filled 2013-05-25 (×2): qty 50

## 2013-05-25 MED ORDER — ALUM & MAG HYDROXIDE-SIMETH 200-200-20 MG/5ML PO SUSP: 30.0000 mL | Freq: Four times a day (QID) | ORAL | Status: DC | PRN

## 2013-05-25 MED ORDER — VANCOMYCIN HCL IN DEXTROSE 1-5 GM/200ML-% IV SOLN
1000.0000 mg | Freq: Three times a day (TID) | INTRAVENOUS | Status: DC
  Administered 2013-05-26: 1000 mg via INTRAVENOUS
  Filled 2013-05-25 (×3): qty 200

## 2013-05-25 MED ORDER — MORPHINE SULFATE ER 30 MG PO TBCR
30.0000 mg | EXTENDED_RELEASE_TABLET | Freq: Two times a day (BID) | ORAL | Status: DC
  Administered 2013-05-25 – 2013-05-29 (×9): 30 mg via ORAL
  Filled 2013-05-25 (×9): qty 1

## 2013-05-25 MED ORDER — GABAPENTIN 300 MG PO CAPS
600.0000 mg | ORAL_CAPSULE | Freq: Three times a day (TID) | ORAL | Status: DC
  Administered 2013-05-25 – 2013-05-29 (×11): 600 mg via ORAL
  Filled 2013-05-25 (×13): qty 2

## 2013-05-25 MED ORDER — ALPRAZOLAM 0.5 MG PO TABS
1.0000 mg | ORAL_TABLET | Freq: Four times a day (QID) | ORAL | Status: DC
  Administered 2013-05-25 – 2013-05-29 (×13): 1 mg via ORAL
  Filled 2013-05-25 (×13): qty 2

## 2013-05-25 MED ORDER — SENNA 8.6 MG PO TABS
1.0000 | ORAL_TABLET | Freq: Two times a day (BID) | ORAL | Status: DC
  Administered 2013-05-25 – 2013-05-29 (×8): 8.6 mg via ORAL
  Filled 2013-05-25 (×10): qty 1

## 2013-05-25 MED ORDER — INSULIN ASPART 100 UNIT/ML ~~LOC~~ SOLN
4.0000 [IU] | Freq: Three times a day (TID) | SUBCUTANEOUS | Status: DC
  Administered 2013-05-25 – 2013-05-29 (×11): 4 [IU] via SUBCUTANEOUS

## 2013-05-25 MED ORDER — NICOTINE 21 MG/24HR TD PT24
21.0000 mg | MEDICATED_PATCH | Freq: Every day | TRANSDERMAL | Status: DC
  Administered 2013-05-25 – 2013-05-29 (×5): 21 mg via TRANSDERMAL
  Filled 2013-05-25 (×5): qty 1

## 2013-05-25 MED ORDER — ENOXAPARIN SODIUM 40 MG/0.4ML ~~LOC~~ SOLN
40.0000 mg | SUBCUTANEOUS | Status: DC
  Administered 2013-05-25 – 2013-05-27 (×3): 40 mg via SUBCUTANEOUS
  Filled 2013-05-25 (×4): qty 0.4

## 2013-05-25 MED ORDER — PIPERACILLIN-TAZOBACTAM 3.375 G IVPB
3.3750 g | Freq: Three times a day (TID) | INTRAVENOUS | Status: DC
  Administered 2013-05-25 – 2013-05-29 (×11): 3.375 g via INTRAVENOUS
  Filled 2013-05-25 (×13): qty 50

## 2013-05-25 MED ORDER — SERTRALINE HCL 100 MG PO TABS
200.0000 mg | ORAL_TABLET | Freq: Every day | ORAL | Status: DC
  Administered 2013-05-25 – 2013-05-29 (×5): 200 mg via ORAL
  Filled 2013-05-25 (×5): qty 2

## 2013-05-25 MED ORDER — INSULIN ASPART 100 UNIT/ML ~~LOC~~ SOLN
0.0000 [IU] | Freq: Three times a day (TID) | SUBCUTANEOUS | Status: DC
  Administered 2013-05-25 – 2013-05-26 (×2): 4 [IU] via SUBCUTANEOUS
  Administered 2013-05-26 – 2013-05-27 (×3): 3 [IU] via SUBCUTANEOUS
  Administered 2013-05-27 – 2013-05-28 (×4): 4 [IU] via SUBCUTANEOUS
  Administered 2013-05-29: 09:00:00 via SUBCUTANEOUS
  Administered 2013-05-29: 4 [IU] via SUBCUTANEOUS

## 2013-05-25 MED ORDER — INSULIN ASPART 100 UNIT/ML ~~LOC~~ SOLN: 0.0000 [IU] | Freq: Every day | SUBCUTANEOUS | Status: DC

## 2013-05-25 MED ORDER — ACETAMINOPHEN 650 MG RE SUPP: 650.0000 mg | Freq: Four times a day (QID) | RECTAL | Status: DC | PRN

## 2013-05-25 MED ORDER — HYDROCODONE-ACETAMINOPHEN 5-325 MG PO TABS
1.0000 | ORAL_TABLET | ORAL | Status: DC | PRN
  Administered 2013-05-25: 1 via ORAL
  Administered 2013-05-26: 2 via ORAL
  Filled 2013-05-25 (×2): qty 2

## 2013-05-25 MED ORDER — VANCOMYCIN HCL 10 G IV SOLR
2000.0000 mg | Freq: Once | INTRAVENOUS | Status: AC
  Administered 2013-05-25: 2000 mg via INTRAVENOUS
  Filled 2013-05-25: qty 2000

## 2013-05-25 MED ORDER — GABAPENTIN 300 MG PO CAPS
300.0000 mg | ORAL_CAPSULE | Freq: Three times a day (TID) | ORAL | Status: DC
  Administered 2013-05-25: 300 mg via ORAL
  Filled 2013-05-25 (×2): qty 1

## 2013-05-25 MED ORDER — CARISOPRODOL 350 MG PO TABS
350.0000 mg | ORAL_TABLET | Freq: Four times a day (QID) | ORAL | Status: DC
  Administered 2013-05-25 – 2013-05-29 (×12): 350 mg via ORAL
  Filled 2013-05-25 (×12): qty 1

## 2013-05-25 MED ORDER — ONDANSETRON HCL 4 MG/2ML IJ SOLN: 4.0000 mg | Freq: Four times a day (QID) | INTRAMUSCULAR | Status: DC | PRN

## 2013-05-25 MED ORDER — INSULIN DETEMIR 100 UNIT/ML ~~LOC~~ SOLN
20.0000 [IU] | Freq: Every day | SUBCUTANEOUS | Status: DC
  Administered 2013-05-25 – 2013-05-28 (×4): 20 [IU] via SUBCUTANEOUS
  Filled 2013-05-25 (×5): qty 0.2

## 2013-05-25 MED ORDER — POTASSIUM CHLORIDE IN NACL 20-0.9 MEQ/L-% IV SOLN
INTRAVENOUS | Status: DC
  Administered 2013-05-25 – 2013-05-28 (×3): via INTRAVENOUS
  Administered 2013-05-28: 50 mL/h via INTRAVENOUS
  Filled 2013-05-25 (×6): qty 1000

## 2013-05-25 MED ORDER — ACETAMINOPHEN 325 MG PO TABS
650.0000 mg | ORAL_TABLET | Freq: Four times a day (QID) | ORAL | Status: DC | PRN
  Administered 2013-05-28: 650 mg via ORAL

## 2013-05-25 MED ORDER — ONDANSETRON HCL 4 MG PO TABS: 4.0000 mg | ORAL_TABLET | Freq: Four times a day (QID) | ORAL | Status: DC | PRN

## 2013-05-25 NOTE — Progress Notes (Signed)
Doctor called to clarify code status.

## 2013-05-25 NOTE — H&P (Signed)
Eddie Rasmussen is an 58 y.o. male.   Chief Complaint: my left foot is hurting HPI:  The patient is a 58 year old Caucasian man with multiple medical problems, most notably diabetes mellitus, type II that is not well controlled and is associated with peripheral neuropathy and peripheral vascular disease, medical noncompliance, ongoing heavy tobacco use, and hyperlipidemia.  He presented to our office today complaining of worsening of bilateral foot ulcers.  His great toenails have fallen off over the past few weeks and is had drainage from both nailbeds (left more than right).  Last week he requested antibiotics and was started on doxycycline.  He's noted increasing drainage from the left foot as well as erythema and aching discomfort on the medial left foot and medial distal left leg. Has not had recent fever, chills, or diaphoresis.  Past Medical History  Diagnosis Date  . Hypertension   . Peripheral vascular disease   . Diabetes mellitus without complication   . Depression   . Arthritis   . Hyperlipidemia   . Tobacco abuse     Medications Prior to Admission  Medication Sig Dispense Refill  . Acetaminophen (TYLENOL PO) Take 2 capsules by mouth every 6 (six) hours as needed (for pain).      Marland Kitchen ALPRAZolam (XANAX) 1 MG tablet Take 1 mg by mouth 3 (three) times daily.      Marland Kitchen amitriptyline (ELAVIL) 100 MG tablet Take 100 mg by mouth at bedtime.      . carisoprodol (SOMA) 350 MG tablet Take 350 mg by mouth 3 (three) times daily.      Marland Kitchen gabapentin (NEURONTIN) 300 MG capsule Take 600 mg by mouth 3 (three) times daily.      Marland Kitchen HYDROcodone-acetaminophen (NORCO) 10-325 MG per tablet Take 1 tablet by mouth 3 (three) times daily.      . insulin NPH Human (HUMULIN N,NOVOLIN N) 100 UNIT/ML injection Inject 30 Units into the skin 2 (two) times daily.      . insulin regular (NOVOLIN R,HUMULIN R) 100 units/mL injection Inject 5 Units into the skin 2 (two) times daily.      . metFORMIN (GLUCOPHAGE) 500 MG tablet  Take 500 mg by mouth every evening.      Marland Kitchen morphine (MS CONTIN) 15 MG 12 hr tablet Take 15 mg by mouth 3 (three) times daily.      . sertraline (ZOLOFT) 100 MG tablet Take 200 mg by mouth daily.        ADDITIONAL HOME MEDICATIONS: No additional home medications  PHYSICIANS INVOLVED IN CARE: Leanna Battles (PCP)  Past Surgical History  Procedure Laterality Date  . Back surgery    . Tonsillectomy    . Cholecystectomy    . L 4/5 spine surgery N/A   . Knee arthroscopy Right     History reviewed. No pertinent family history.   Social History:  reports that he has been smoking Cigarettes.  He has a 80 pack-year smoking history. He does not have any smokeless tobacco history on file. He reports that he does not drink alcohol. His drug history is not on file.  Allergies:  Allergies  Allergen Reactions  . Ibuprofen Nausea Only and Swelling     ROS: ankle swelling, arthritis, diabetes, high blood pressure, tobacco use and intermittent skin ulcers,  peripheral neuropathy, hypertriglyceridemia,, depression, anxiety  PHYSICAL EXAM: Blood pressure 113/78, pulse 108, temperature 98.6 F (37 C), temperature source Oral, resp. rate 18, SpO2 95.00%. In general, the patient is a well-nourished well-developed  white man who was in no apparent distress.  HEENT exam was within normal limits, neck was supple without jugular venous distention or carotid bruit, chest was clear to auscultation, heart had a regular rate and rhythm, abdomen had normal bowel sounds and no hepatosplenomegaly or tenderness, he had bilateral 1+ edema of the legs (left greater than right) and had bilateral 2+ dorsalis pedis pulses and absent posterior tibial pulses. He has markedly decreased light touch sensation in both feet.  He has deep ulcers in the bilateral great toe nailbeds with a moderate amount of necrotic tissue and serous drainage and no bone or tendon visualized.  There is slight erythema of the medial left foot but  no fluctuance.  He also has a superficial ulcer on the upper left chest wall without associated fluctuance.  He was alert and well oriented with a flat affect and was able to move all extremities well.  Has a normal gait.  Results for orders placed during the hospital encounter of 05/25/13 (from the past 48 hour(s))  CBC     Status: Abnormal   Collection Time    05/25/13  2:35 PM      Result Value Ref Range   WBC 8.5  4.0 - 10.5 K/uL   RBC 4.41  4.22 - 5.81 MIL/uL   Comment: QA FLAGS AND/OR RANGES MODIFIED BY DEMOGRAPHIC UPDATE ON 05/13 AT 1602   Hemoglobin 12.4 (*) 13.0 - 17.0 g/dL   Comment: QA FLAGS AND/OR RANGES MODIFIED BY DEMOGRAPHIC UPDATE ON 05/13 AT 1602   HCT 36.7 (*) 39.0 - 52.0 %   Comment: QA FLAGS AND/OR RANGES MODIFIED BY DEMOGRAPHIC UPDATE ON 05/13 AT 1602   MCV 83.2  78.0 - 100.0 fL   MCH 28.1  26.0 - 34.0 pg   MCHC 33.8  30.0 - 36.0 g/dL   RDW 13.4  11.5 - 15.5 %   Platelets 234  150 - 400 K/uL  COMPREHENSIVE METABOLIC PANEL     Status: Abnormal   Collection Time    05/25/13  2:35 PM      Result Value Ref Range   Sodium 137  137 - 147 mEq/L   Potassium 4.9  3.7 - 5.3 mEq/L   Chloride 99  96 - 112 mEq/L   CO2 27  19 - 32 mEq/L   Glucose, Bld 250 (*) 70 - 99 mg/dL   BUN 15  6 - 23 mg/dL   Creatinine, Ser 1.00  0.50 - 1.35 mg/dL   Comment: QA FLAGS AND/OR RANGES MODIFIED BY DEMOGRAPHIC UPDATE ON 05/13 AT 1602   Calcium 9.0  8.4 - 10.5 mg/dL   Total Protein 6.8  6.0 - 8.3 g/dL   Albumin 3.3 (*) 3.5 - 5.2 g/dL   AST 12  0 - 37 U/L   ALT 9  0 - 53 U/L   Comment: QA FLAGS AND/OR RANGES MODIFIED BY DEMOGRAPHIC UPDATE ON 05/13 AT 1602   Alkaline Phosphatase 101  39 - 117 U/L   Total Bilirubin 0.2 (*) 0.3 - 1.2 mg/dL   GFR calc non Af Amer 61 (*) >90 mL/min   GFR calc Af Amer 71 (*) >90 mL/min   Comment: (NOTE)     The eGFR has been calculated using the CKD EPI equation.     This calculation has not been validated in all clinical situations.     eGFR's persistently  <90 mL/min signify possible Chronic Kidney     Disease.  TSH  Status: None   Collection Time    05/25/13  2:35 PM      Result Value Ref Range   TSH 3.370  0.350 - 4.500 uIU/mL   Comment: Please note change in reference range.  GLUCOSE, CAPILLARY     Status: Abnormal   Collection Time    05/25/13  5:49 PM      Result Value Ref Range   Glucose-Capillary 154 (*) 70 - 99 mg/dL   Comment 1 Documented in Chart     Comment 2 Notify RN    URINALYSIS, ROUTINE W REFLEX MICROSCOPIC     Status: Abnormal   Collection Time    05/25/13  5:57 PM      Result Value Ref Range   Color, Urine AMBER (*) YELLOW   Comment: BIOCHEMICALS MAY BE AFFECTED BY COLOR   APPearance CLEAR  CLEAR   Specific Gravity, Urine 1.024  1.005 - 1.030   pH 5.0  5.0 - 8.0   Glucose, UA NEGATIVE  NEGATIVE mg/dL   Hgb urine dipstick NEGATIVE  NEGATIVE   Bilirubin Urine SMALL (*) NEGATIVE   Ketones, ur 15 (*) NEGATIVE mg/dL   Protein, ur NEGATIVE  NEGATIVE mg/dL   Urobilinogen, UA 1.0  0.0 - 1.0 mg/dL   Nitrite NEGATIVE  NEGATIVE   Leukocytes, UA SMALL (*) NEGATIVE  URINE MICROSCOPIC-ADD ON     Status: Abnormal   Collection Time    05/25/13  5:57 PM      Result Value Ref Range   Squamous Epithelial / LPF RARE  RARE   WBC, UA 3-6  <3 WBC/hpf   Bacteria, UA RARE  RARE   Casts HYALINE CASTS (*) NEGATIVE   Urine-Other MUCOUS PRESENT     No results found.   Assessment/Plan #1 Left diabetic foot ulcer: This is a deep ulcer and limb threatening.  The extent of the ulcer is unclear and he may have an abscess in the foot or osteomyelitis.  He was admitted for IV antibiotics and expeditious evaluation with x-rays of the feet and an MRI exam of the left foot.  We will also request an orthopedic specialist evaluation.  I've discussed with him that he may need partial amputation of the left foot.  Many times in the past we have discussed the importance of complete cessation of cigarette smoking that likely has contributed to  ulcer development. We will also check x-rays of the right foot given the ulcer on the great toe. #2 Diabetes Mellitus, type 2 complicated by peripheral neuropathy: He intermittently takes his medications for diabetes and generally has a high hemoglobin A1c in the 10-12 range despite discussions of the importance of tight control of blood glucose levels to help prevent complications of diabetes.  While he is an inpatient we will have him on long-acting as well as short-acting sliding scale insulin. #3 Depression: chronic and stable on current medications #4 Hyperlipidemia: in the past he has had moderate elevation of triglycerides despite fenofibrate treatment.  We will recheck fasting lipids when his blood glucose levels are under good control. #5 Tobacco Abuse: he has ongoing moderately heavy tobacco abuse and we will use a nicotine patch while he is in the hospital.  Leanna Battles 05/25/2013, 6:44 PM

## 2013-05-25 NOTE — Progress Notes (Signed)
ARRIVED TO ROOM 5W25 FROM OFFICE, A/OX44, DENIES NAUSEA, C/O LEFT FOOT PAIN 5/10, INSTRUCTED ON USAGE OF CALL LIGHT AND ROOM SURROUNDINGS

## 2013-05-25 NOTE — Progress Notes (Addendum)
ANTIBIOTIC CONSULT NOTE - INITIAL  Pharmacy Consult for Vancomycin Indication: Deep limb threatening foot ulcers  Allergies  Allergen Reactions  . Ibuprofen Nausea Only and Swelling    Patient Measurements: Height: 6\' 1"  (185.4 cm) Weight: 257 lb (116.574 kg) IBW/kg (Calculated) : 79.9 Adjusted Body Weight:   Vital Signs: Temp: 98.6 F (37 C) (05/13 1330) Temp src: Oral (05/13 1330) BP: 113/78 mmHg (05/13 1330) Pulse Rate: 108 (05/13 1330) Intake/Output from previous day:   Intake/Output from this shift:    Labs:  Recent Labs  05/25/13 1435  WBC 8.5  HGB 12.4*  PLT 234  CREATININE 1.00   Estimated Creatinine Clearance: 109.1 ml/min (by C-G formula based on Cr of 1). No results found for this basename: VANCOTROUGH, VANCOPEAK, VANCORANDOM, GENTTROUGH, GENTPEAK, GENTRANDOM, TOBRATROUGH, TOBRAPEAK, TOBRARND, AMIKACINPEAK, AMIKACINTROU, AMIKACIN,  in the last 72 hours   Microbiology: No results found for this or any previous visit (from the past 720 hour(s)).  Medical History: Past Medical History  Diagnosis Date  . Hypertension   . Peripheral vascular disease   . Diabetes mellitus without complication   . Depression   . Arthritis   . Hyperlipidemia   . Tobacco abuse     Medications:  Scheduled:  . ALPRAZolam  1 mg Oral QID  . amitriptyline  100 mg Oral QHS  . carisoprodol  350 mg Oral QID  . enoxaparin (LOVENOX) injection  40 mg Subcutaneous Q24H  . gabapentin  600 mg Oral TID  . insulin aspart  0-20 Units Subcutaneous TID WC  . insulin aspart  0-5 Units Subcutaneous QHS  . insulin aspart  4 Units Subcutaneous TID WC  . insulin detemir  20 Units Subcutaneous QHS  . morphine  30 mg Oral Q12H  . nicotine  21 mg Transdermal Daily  . piperacillin-tazobactam (ZOSYN)  IV  3.375 g Intravenous Q8H  . senna  1 tablet Oral BID  . sertraline  200 mg Oral Daily  . vancomycin  2,000 mg Intravenous Once  . [START ON 05/26/2013] vancomycin  1,000 mg Intravenous Q8H    Assessment: 58 yr old male with uncontrolled DMII presents with worsening bilateral foot ulcers. He has been on doxycycline since last week which is not helping. He continues to smoke which is detrimental to healing.   Goal of Therapy:  Vancomycin trough level 15-20 mcg/ml  Plan:  Vancomycin 2000 mg IV loading dose, then 1 Gm IV q8h. Levels when appropriate.   Lourdes SledgeBarbara Sue Potter 05/25/2013,7:53 PM  Addum:  Will change vancomycin to 1gm IV q12 hours as per obesity nomogram.  Check trough at [facility].  Thanks for allowing pharmacy to be a part of this patient's care.  Talbert CageLora Arihaan Bellucci, PharmD Clinical Pharmacist, [phone]

## 2013-05-26 ENCOUNTER — Inpatient Hospital Stay (HOSPITAL_COMMUNITY): Payer: Medicare Other

## 2013-05-26 LAB — CBC
HCT: 36.2 % — ABNORMAL LOW (ref 39.0–52.0)
Hemoglobin: 12.2 g/dL — ABNORMAL LOW (ref 13.0–17.0)
MCH: 27.9 pg (ref 26.0–34.0)
MCHC: 33.7 g/dL (ref 30.0–36.0)
MCV: 82.6 fL (ref 78.0–100.0)
PLATELETS: 227 10*3/uL (ref 150–400)
RBC: 4.38 MIL/uL (ref 4.22–5.81)
RDW: 13.2 % (ref 11.5–15.5)
WBC: 6 10*3/uL (ref 4.0–10.5)

## 2013-05-26 LAB — BASIC METABOLIC PANEL
BUN: 11 mg/dL (ref 6–23)
CO2: 26 mEq/L (ref 19–32)
Calcium: 8.7 mg/dL (ref 8.4–10.5)
Chloride: 101 mEq/L (ref 96–112)
Creatinine, Ser: 0.85 mg/dL (ref 0.50–1.35)
GLUCOSE: 147 mg/dL — AB (ref 70–99)
Potassium: 4.8 mEq/L (ref 3.7–5.3)
Sodium: 138 mEq/L (ref 137–147)

## 2013-05-26 LAB — GLUCOSE, CAPILLARY
GLUCOSE-CAPILLARY: 142 mg/dL — AB (ref 70–99)
GLUCOSE-CAPILLARY: 185 mg/dL — AB (ref 70–99)
Glucose-Capillary: 146 mg/dL — ABNORMAL HIGH (ref 70–99)
Glucose-Capillary: 175 mg/dL — ABNORMAL HIGH (ref 70–99)
Glucose-Capillary: 188 mg/dL — ABNORMAL HIGH (ref 70–99)

## 2013-05-26 MED ORDER — VANCOMYCIN HCL IN DEXTROSE 1-5 GM/200ML-% IV SOLN
1000.0000 mg | Freq: Two times a day (BID) | INTRAVENOUS | Status: DC
  Administered 2013-05-26 – 2013-05-29 (×6): 1000 mg via INTRAVENOUS
  Filled 2013-05-26 (×7): qty 200

## 2013-05-26 MED ORDER — VERAPAMIL HCL 80 MG PO TABS
80.0000 mg | ORAL_TABLET | Freq: Three times a day (TID) | ORAL | Status: DC
  Administered 2013-05-26 – 2013-05-29 (×9): 80 mg via ORAL
  Filled 2013-05-26 (×14): qty 1

## 2013-05-26 MED ORDER — COLLAGENASE 250 UNIT/GM EX OINT
TOPICAL_OINTMENT | Freq: Every day | CUTANEOUS | Status: DC
  Administered 2013-05-26: 11:00:00 via TOPICAL
  Administered 2013-05-27 – 2013-05-28 (×2): 1 via TOPICAL
  Filled 2013-05-26 (×2): qty 30

## 2013-05-26 MED ORDER — GADOBENATE DIMEGLUMINE 529 MG/ML IV SOLN
20.0000 mL | Freq: Once | INTRAVENOUS | Status: AC | PRN
  Administered 2013-05-26: 20 mL via INTRAVENOUS

## 2013-05-26 NOTE — Progress Notes (Addendum)
MD office notified of patient's BP of 162/92 and  pulse of 98-100 and that patient reports a headache. MD office told RN that they will discuss BP with MD and call back with MD orders.   2:15 PM Orders received. Will continue to monitor

## 2013-05-26 NOTE — Progress Notes (Signed)
Utilization review completed.  

## 2013-05-26 NOTE — Progress Notes (Signed)
Subjective: Sleeping comfortably this morning.  Objective: Vital signs in last 24 hours: Temp:  [98.2 F (36.8 C)-98.6 F (37 C)] 98.2 F (36.8 C) (05/14 0501) Pulse Rate:  [96-108] 96 (05/14 0501) Resp:  [18-20] 18 (05/14 0501) BP: (112-157)/(73-96) 112/73 mmHg (05/14 0501) SpO2:  [95 %-97 %] 96 % (05/14 0501) Weight:  [116.574 kg (257 lb)] 116.574 kg (257 lb) (05/13 1900) Weight change:    Intake/Output from previous day: 05/13 0701 - 05/14 0700 In: 540 [P.O.:540] Out: -    General appearance: no distress Resp: clear to auscultation bilaterally Cardio: regular rate and rhythm GI: soft, non-tender; bowel sounds normal; no masses,  no organomegaly Extremities: bilateral 1+ leg edema (left more than right) less erythema distal left leg and foot  Lab Results:  Recent Labs  05/25/13 1435 05/26/13 0549  WBC 8.5 6.0  HGB 12.4* 12.2*  HCT 36.7* 36.2*  PLT 234 227   BMET  Recent Labs  05/25/13 1435 05/26/13 0549  NA 137 138  K 4.9 4.8  CL 99 101  CO2 27 26  GLUCOSE 250* 147*  BUN 15 11  CREATININE 1.00 0.85  CALCIUM 9.0 8.7   CMET CMP     Component Value Date/Time   NA 138 05/26/2013 0549   K 4.8 05/26/2013 0549   CL 101 05/26/2013 0549   CO2 26 05/26/2013 0549   GLUCOSE 147* 05/26/2013 0549   BUN 11 05/26/2013 0549   CREATININE 0.85 05/26/2013 0549   CALCIUM 8.7 05/26/2013 0549   PROT 6.8 05/25/2013 1435   ALBUMIN 3.3* 05/25/2013 1435   AST 12 05/25/2013 1435   ALT 9 05/25/2013 1435   ALKPHOS 101 05/25/2013 1435   BILITOT 0.2* 05/25/2013 1435   GFRNONAA >90 05/26/2013 0549   GFRAA >90 05/26/2013 0549    CBG (last 3)   Recent Labs  05/25/13 1749 05/25/13 2118  GLUCAP 154* 135*    INR RESULTS:   No results found for this basename: INR, PROTIME     Studies/Results: X-ray Chest Pa And Lateral   05/26/2013   CLINICAL DATA:  Shortness of breath  EXAM: CHEST  2 VIEW  COMPARISON:  None.  FINDINGS: The heart size and mediastinal contours are within normal  limits. Both lungs are clear. The visualized skeletal structures are unremarkable.  IMPRESSION: No active cardiopulmonary disease.   Electronically Signed   By: Alcide CleverMark  Lukens M.D.   On: 05/26/2013 00:33   Dg Foot 2 Views Left  05/26/2013   CLINICAL DATA:  Toe pain  EXAM: LEFT FOOT - 2 VIEW  COMPARISON:  None.  FINDINGS: There is no evidence of fracture or dislocation. There is no evidence of arthropathy or other focal bone abnormality. Soft tissues are unremarkable. An accessory ossicle is noted adjacent to the navicular bone medially. Small bony densities are noted adjacent to the base of the fifth metatarsal. These may be related to prior injury. Similar changes are noted adjacent to the distal fibula.  IMPRESSION: Chronic changes without acute abnormality.   Electronically Signed   By: Alcide CleverMark  Lukens M.D.   On: 05/26/2013 00:30   Dg Foot 2 Views Right  05/26/2013   CLINICAL DATA:  Toe pain  EXAM: RIGHT FOOT - 2 VIEW  COMPARISON:  None.  FINDINGS: There is no evidence of fracture or dislocation. There is no evidence of arthropathy or other focal bone abnormality. Soft tissues are unremarkable.  IMPRESSION: No acute abnormality noted.   Electronically Signed   By: Loraine LericheMark  Lukens M.D.   On: 05/26/2013 00:29    Medications: I have reviewed the patient's current medications.  Assessment/Plan: #1 Left Foot Ulcer: unclear as to whether or not he has osteomyelitis. Await results of foot MRI today, appreciate input from Dr. Lajoyce Cornersuda. #2 DM2: sugars under good control on current meds.   LOS: 1 day   Jarome MatinDaniel Lovelee Forner 05/26/2013, 7:12 AM

## 2013-05-26 NOTE — Progress Notes (Signed)
Explained to patient about safety and importance of using bed alarms. Patient refused stating, "I only fell once when I got out of the shower. I don't want the bed alarm" Fall safety handout provided. Patient refused to watch safety video.

## 2013-05-26 NOTE — Progress Notes (Addendum)
Spoke with patient about his diabetes. Was diagnosed in 1989.  Was admitted to the hospital because of foot infection and to get IV antibiotics. Does take care of his disabled and bedridden wife at home.  Takes insulin NPH Reli-on insulin 30 units at breakfast and bedtime, and Novolin Regular 5 units at breakfast and supper at home.  Also takes Metformin.  States that he does not eat 3 meals a day and does frequently have low blood sugars early in the AM.  Has taken Levemir before, but has been unable to afford the copay for the health insurance.  States that he has asked for coupons from the company, but has not received them.  Spoke with case manager on 5W about his situation. Not sure that care management can resolve the problems with his situation.   Will give patient a coupon for Levemir that is for $25 copay for 2 years if patient were to start taking Levemir again.  Patient states that Levemir seemed to work better for him. Will continue to watch in hospital.  Smith MinceKendra Deepika Decatur RN BSN CDE

## 2013-05-26 NOTE — Consult Note (Addendum)
Reason for Consult: Cellulitis bilateral great toes left worse than right Referring Physician: Dr. Tim Lair Eddie Rasmussen is an 58 y.o. male.  HPI: Patient is a 58 year old gentleman with diabetes peripheral vascular disease who states she's had chronic problems with ulcerations from the nail of both feet. Patient states he started developing more cellulitis and problems with the left great toe. He states he's had most problems with the right great toe.  Past Medical History  Diagnosis Date  . Hypertension   . Peripheral vascular disease   . Depression   . Hyperlipidemia   . Tobacco abuse   . COPD (chronic obstructive pulmonary disease)   . Daily headache   . Arthritis     "all my joints" (05/25/2013)  . Chronic lower back pain   . Anxiety   . Bell's palsy ~ 2000  . Peripheral neuropathy   . Type II diabetes mellitus, uncontrolled     Archie Endo 05/25/2013  . Diabetic foot ulcer admitted 05/25/2013    left  . Poor short term memory     Past Surgical History  Procedure Laterality Date  . Back surgery    . Tonsillectomy    . Cholecystectomy    . Lumbar disc surgery N/A 06/2003    "L4-5"  . Knee arthroscopy Right 09/2002; 12/2002  . Microdiscectomy lumbar Left 08/2003  . Cataract extraction w/ intraocular lens implant Right ?2007  . Multiple tooth extractions      "took most of my teeth out"    History reviewed. No pertinent family history.  Social History:  reports that he has been smoking Cigarettes.  He has a 82 pack-year smoking history. He has never used smokeless tobacco. He reports that he drinks alcohol. He reports that he does not use illicit drugs.  Allergies:  Allergies  Allergen Reactions  . Ibuprofen Nausea Only and Swelling    Medications: I have reviewed the patient's current medications.  Results for orders placed during the hospital encounter of 05/25/13 (from the past 48 hour(s))  CBC     Status: Abnormal   Collection Time    05/25/13  2:35 PM      Result  Value Ref Range   WBC 8.5  4.0 - 10.5 K/uL   RBC 4.41  4.22 - 5.81 MIL/uL   Comment: QA FLAGS AND/OR RANGES MODIFIED BY DEMOGRAPHIC UPDATE ON 05/13 AT 1602   Hemoglobin 12.4 (*) 13.0 - 17.0 g/dL   Comment: QA FLAGS AND/OR RANGES MODIFIED BY DEMOGRAPHIC UPDATE ON 05/13 AT 1602   HCT 36.7 (*) 39.0 - 52.0 %   Comment: QA FLAGS AND/OR RANGES MODIFIED BY DEMOGRAPHIC UPDATE ON 05/13 AT 1602   MCV 83.2  78.0 - 100.0 fL   MCH 28.1  26.0 - 34.0 pg   MCHC 33.8  30.0 - 36.0 g/dL   RDW 13.4  11.5 - 15.5 %   Platelets 234  150 - 400 K/uL  COMPREHENSIVE METABOLIC PANEL     Status: Abnormal   Collection Time    05/25/13  2:35 PM      Result Value Ref Range   Sodium 137  137 - 147 mEq/L   Potassium 4.9  3.7 - 5.3 mEq/L   Chloride 99  96 - 112 mEq/L   CO2 27  19 - 32 mEq/L   Glucose, Bld 250 (*) 70 - 99 mg/dL   BUN 15  6 - 23 mg/dL   Creatinine, Ser 1.00  0.50 - 1.35 mg/dL  Comment: QA FLAGS AND/OR RANGES MODIFIED BY DEMOGRAPHIC UPDATE ON 05/13 AT 1602   Calcium 9.0  8.4 - 10.5 mg/dL   Total Protein 6.8  6.0 - 8.3 g/dL   Albumin 3.3 (*) 3.5 - 5.2 g/dL   AST 12  0 - 37 U/L   ALT 9  0 - 53 U/L   Comment: QA FLAGS AND/OR RANGES MODIFIED BY DEMOGRAPHIC UPDATE ON 05/13 AT 1602   Alkaline Phosphatase 101  39 - 117 U/L   Total Bilirubin 0.2 (*) 0.3 - 1.2 mg/dL   GFR calc non Af Amer 61 (*) >90 mL/min   GFR calc Af Amer 71 (*) >90 mL/min   Comment: (NOTE)     The eGFR has been calculated using the CKD EPI equation.     This calculation has not been validated in all clinical situations.     eGFR's persistently <90 mL/min signify possible Chronic Kidney     Disease.  TSH     Status: None   Collection Time    05/25/13  2:35 PM      Result Value Ref Range   TSH 3.370  0.350 - 4.500 uIU/mL   Comment: Please note change in reference range.  HEMOGLOBIN A1C     Status: Abnormal   Collection Time    05/25/13  2:35 PM      Result Value Ref Range   Hemoglobin A1C 8.8 (*) <5.7 %   Comment: (NOTE)                                                                                According to the ADA Clinical Practice Recommendations for 2011, when     HbA1c is used as a screening test:      >=6.5%   Diagnostic of Diabetes Mellitus               (if abnormal result is confirmed)     5.7-6.4%   Increased risk of developing Diabetes Mellitus     References:Diagnosis and Classification of Diabetes Mellitus,Diabetes     MGNO,0370,48(GQBVQ 1):S62-S69 and Standards of Medical Care in             Diabetes - 2011,Diabetes Care,2011,34 (Suppl 1):S11-S61.   Mean Plasma Glucose 206 (*) <117 mg/dL   Comment: Performed at [facility], [facility]     Status: Abnormal   Collection Time    05/25/13  5:49 PM      Result Value Ref Range   Glucose-Capillary 154 (*) 70 - 99 mg/dL   Comment 1 Documented in Chart     Comment 2 Notify RN    URINALYSIS, ROUTINE W REFLEX MICROSCOPIC     Status: Abnormal   Collection Time    05/25/13  5:57 PM      Result Value Ref Range   Color, Urine AMBER (*) YELLOW   Comment: BIOCHEMICALS MAY BE AFFECTED BY COLOR   APPearance CLEAR  CLEAR   Specific Gravity, Urine 1.024  1.005 - 1.030   pH 5.0  5.0 - 8.0   Glucose, UA NEGATIVE  NEGATIVE mg/dL   Hgb urine dipstick NEGATIVE  NEGATIVE   Bilirubin Urine SMALL (*)  NEGATIVE   Ketones, ur 15 (*) NEGATIVE mg/dL   Protein, ur NEGATIVE  NEGATIVE mg/dL   Urobilinogen, UA 1.0  0.0 - 1.0 mg/dL   Nitrite NEGATIVE  NEGATIVE   Leukocytes, UA SMALL (*) NEGATIVE  URINE MICROSCOPIC-ADD ON     Status: Abnormal   Collection Time    05/25/13  5:57 PM      Result Value Ref Range   Squamous Epithelial / LPF RARE  RARE   WBC, UA 3-6  <3 WBC/hpf   Bacteria, UA RARE  RARE   Casts HYALINE CASTS (*) NEGATIVE   Urine-Other MUCOUS PRESENT    GLUCOSE, CAPILLARY     Status: Abnormal   Collection Time    05/25/13  9:18 PM      Result Value Ref Range   Glucose-Capillary 135 (*) 70 - 99 mg/dL   Comment 1 Documented in Chart      Comment 2 Notify RN    CBC     Status: Abnormal   Collection Time    05/26/13  5:49 AM      Result Value Ref Range   WBC 6.0  4.0 - 10.5 K/uL   RBC 4.38  4.22 - 5.81 MIL/uL   Hemoglobin 12.2 (*) 13.0 - 17.0 g/dL   HCT 36.2 (*) 39.0 - 52.0 %   MCV 82.6  78.0 - 100.0 fL   MCH 27.9  26.0 - 34.0 pg   MCHC 33.7  30.0 - 36.0 g/dL   RDW 13.2  11.5 - 15.5 %   Platelets 227  150 - 400 K/uL    X-ray Chest Pa And Lateral   05/26/2013   CLINICAL DATA:  Shortness of breath  EXAM: CHEST  2 VIEW  COMPARISON:  None.  FINDINGS: The heart size and mediastinal contours are within normal limits. Both lungs are clear. The visualized skeletal structures are unremarkable.  IMPRESSION: No active cardiopulmonary disease.   Electronically Signed   By: Inez Catalina M.D.   On: 05/26/2013 00:33   Dg Foot 2 Views Left  05/26/2013   CLINICAL DATA:  Toe pain  EXAM: LEFT FOOT - 2 VIEW  COMPARISON:  None.  FINDINGS: There is no evidence of fracture or dislocation. There is no evidence of arthropathy or other focal bone abnormality. Soft tissues are unremarkable. An accessory ossicle is noted adjacent to the navicular bone medially. Small bony densities are noted adjacent to the base of the fifth metatarsal. These may be related to prior injury. Similar changes are noted adjacent to the distal fibula.  IMPRESSION: Chronic changes without acute abnormality.   Electronically Signed   By: Inez Catalina M.D.   On: 05/26/2013 00:30   Dg Foot 2 Views Right  05/26/2013   CLINICAL DATA:  Toe pain  EXAM: RIGHT FOOT - 2 VIEW  COMPARISON:  None.  FINDINGS: There is no evidence of fracture or dislocation. There is no evidence of arthropathy or other focal bone abnormality. Soft tissues are unremarkable.  IMPRESSION: No acute abnormality noted.   Electronically Signed   By: Inez Catalina M.D.   On: 05/26/2013 00:29    Review of Systems  All other systems reviewed and are negative.  Blood pressure 112/73, pulse 96, temperature 98.2 F  (36.8 C), temperature source Oral, resp. rate 18, height 6' 1"  (1.854 m), weight 116.574 kg (257 lb), SpO2 96.00%. Physical Exam On examination patient has a good dorsalis pedis pulse bilaterally. He has necrotic tissue dorsally over the nailbed of  the left great toe. Where the lines have been drawn for cellulitis in his foot the cellulitis has receded significantly after IV antibiotics. The cellulitis in the right great toe is almost completely resolved. The nail bed is healthy granulation tissue on the right. Bone is not palpable on either great toe. Radiographs are reviewed from both feet shows no destructive bony changes no signs of chronic osteomyelitis. Assessment/Plan: Assessment: Resolving cellulitis bilateral great toes with necrotic tissue in the nailbed of the left great toe.  Plan: Patient is scheduled for an MRI of the left great toe will see if this shows any changes consistent with osteomyelitis. If the MRI scan is negative would recommend continuing his IV antibiotics as an inpatient through the weekend and then discharged to home on oral antibiotics. I will followup after the MRI scan and will followup after discharge. Orders written for Santyl dressing changes for both great toes  Newt Minion 05/26/2013, 6:34 AM

## 2013-05-27 DIAGNOSIS — M79609 Pain in unspecified limb: Secondary | ICD-10-CM

## 2013-05-27 LAB — GLUCOSE, CAPILLARY
GLUCOSE-CAPILLARY: 187 mg/dL — AB (ref 70–99)
GLUCOSE-CAPILLARY: 154 mg/dL — AB (ref 70–99)
Glucose-Capillary: 139 mg/dL — ABNORMAL HIGH (ref 70–99)
Glucose-Capillary: 152 mg/dL — ABNORMAL HIGH (ref 70–99)

## 2013-05-27 MED ORDER — CHLORHEXIDINE GLUCONATE 4 % EX LIQD
60.0000 mL | Freq: Once | CUTANEOUS | Status: DC
  Filled 2013-05-27: qty 60

## 2013-05-27 NOTE — Progress Notes (Signed)
Patient ID: Eddie AcresJames F Crisco, male   DOB: 23-Aug-1955, 58 y.o.   MRN: [id number] Review of MRI scan from last night is consistent with osteomyelitis of the left great toe. I have discussed treatment options with the patient and have recommended proceeding with an amputation of the left great toe at the MTP joint. Patient states he understands and wishes to proceed at this time I will have patient n.p.o. after midnight tonight with surgery 7:30 in the morning. Patient could be discharged to home postoperatively. Dr. Magnus IvanBlackman will do the surgery

## 2013-05-27 NOTE — Progress Notes (Signed)
VASCULAR LAB PRELIMINARY  ARTERIAL  ABI completed: bilateral ABI within normal limits    RIGHT    LEFT    PRESSURE WAVEFORM  PRESSURE WAVEFORM  BRACHIAL 136 tri BRACHIAL 135 tri  DP   DP    AT 143 tri AT 175 tri  PT 183 tri PT 149 tri  PER   PER    GREAT TOE  NA GREAT TOE  NA    RIGHT LEFT  ABI 1.35 1.29     Eddie Rasmussen, RVT 05/27/2013, 1:53 PM

## 2013-05-27 NOTE — Progress Notes (Signed)
Blood sugar 152. Order states to call MD if over 140. Contacted his office Pura SpiceDonna Jones RN stated she will send him a message with the details.

## 2013-05-27 NOTE — Progress Notes (Signed)
Subjective: Left foot pain is improved, no dyspnea, appetite is good and no constipation.  Objective: Vital signs in last 24 hours: Temp:  [97.9 F (36.6 C)-98.6 F (37 C)] 98.2 F (36.8 C) (05/15 0549) Pulse Rate:  [84-100] 84 (05/15 0549) Resp:  [15-16] 15 (05/15 0549) BP: (130-164)/(82-92) 130/86 mmHg (05/15 0549) SpO2:  [93 %-98 %] 93 % (05/15 0549) Weight change:    Intake/Output from previous day: 05/14 0701 - 05/15 0700 In: 2592.7 [P.O.:666; I.V.:1926.7] Out: -    General appearance: alert, cooperative and no distress Resp: clear to auscultation bilaterally Cardio: regular rate and rhythm GI: soft, non-tender; bowel sounds normal; no masses,  no organomegaly Extremities: decreased erythema and edema of the left leg and foot  Lab Results:  Recent Labs  05/25/13 1435 05/26/13 0549  WBC 8.5 6.0  HGB 12.4* 12.2*  HCT 36.7* 36.2*  PLT 234 227   BMET  Recent Labs  05/25/13 1435 05/26/13 0549  NA 137 138  K 4.9 4.8  CL 99 101  CO2 27 26  GLUCOSE 250* 147*  BUN 15 11  CREATININE 1.00 0.85  CALCIUM 9.0 8.7   CMET CMP     Component Value Date/Time   NA 138 05/26/2013 0549   K 4.8 05/26/2013 0549   CL 101 05/26/2013 0549   CO2 26 05/26/2013 0549   GLUCOSE 147* 05/26/2013 0549   BUN 11 05/26/2013 0549   CREATININE 0.85 05/26/2013 0549   CALCIUM 8.7 05/26/2013 0549   PROT 6.8 05/25/2013 1435   ALBUMIN 3.3* 05/25/2013 1435   AST 12 05/25/2013 1435   ALT 9 05/25/2013 1435   ALKPHOS 101 05/25/2013 1435   BILITOT 0.2* 05/25/2013 1435   GFRNONAA >90 05/26/2013 0549   GFRAA >90 05/26/2013 0549    CBG (last 3)   Recent Labs  05/26/13 2229 05/27/13 0758 05/27/13 1151  GLUCAP 188* 139* 152*    INR RESULTS:   No results found for this basename: INR, PROTIME     Studies/Results: X-ray Chest Pa And Lateral   05/26/2013   CLINICAL DATA:  Shortness of breath  EXAM: CHEST  2 VIEW  COMPARISON:  None.  FINDINGS: The heart size and mediastinal contours are within  normal limits. Both lungs are clear. The visualized skeletal structures are unremarkable.  IMPRESSION: No active cardiopulmonary disease.   Electronically Signed   By: Alcide CleverMark  Lukens M.D.   On: 05/26/2013 00:33   Mr Foot Left W Wo Contrast  05/27/2013   CLINICAL DATA:  Toe swelling and erythema. Evaluate for abscess or osteomyelitis.  EXAM: MRI OF THE LEFT FOREFOOT WITHOUT AND WITH CONTRAST  TECHNIQUE: Multiplanar, multisequence MR imaging was performed both before and after administration of intravenous contrast.  CONTRAST:  20mL MULTIHANCE GADOBENATE DIMEGLUMINE 529 MG/ML IV SOLN  COMPARISON:  Radiographs 05/25/2013.  FINDINGS: There is diffuse forefoot soft tissue edema, especially within the dorsal subcutaneous fat. There is mild edema within the flexor musculature, and a small amount of fluid is present within the flexor tendon sheaths. There is no focal or drainable fluid collection.  The distal phalanx of the great toe demonstrates diffuse marrow T2 hyperintensity and enhancement following contrast. T1 weighted images demonstrate possible early cortical thinning but are suboptimal in quality. There is no significant interphalangeal joint effusion. There is no abnormal signal within the proximal phalanx or first metatarsal. The additional toes and metatarsals appear normal. Type 2 accessory navicular noted incidentally.  IMPRESSION: 1. Forefoot cellulitis without evidence of soft tissue  abscess. 2. Marrow edema and hyperenhancement within the distal phalanx of the great toe suspicious for early osteomyelitis.   Electronically Signed   By: Roxy HorsemanBill  Veazey M.D.   On: 05/27/2013 07:42   Dg Foot 2 Views Left  05/26/2013   CLINICAL DATA:  Toe pain  EXAM: LEFT FOOT - 2 VIEW  COMPARISON:  None.  FINDINGS: There is no evidence of fracture or dislocation. There is no evidence of arthropathy or other focal bone abnormality. Soft tissues are unremarkable. An accessory ossicle is noted adjacent to the navicular bone  medially. Small bony densities are noted adjacent to the base of the fifth metatarsal. These may be related to prior injury. Similar changes are noted adjacent to the distal fibula.  IMPRESSION: Chronic changes without acute abnormality.   Electronically Signed   By: Alcide CleverMark  Lukens M.D.   On: 05/26/2013 00:30   Dg Foot 2 Views Right  05/26/2013   CLINICAL DATA:  Toe pain  EXAM: RIGHT FOOT - 2 VIEW  COMPARISON:  None.  FINDINGS: There is no evidence of fracture or dislocation. There is no evidence of arthropathy or other focal bone abnormality. Soft tissues are unremarkable.  IMPRESSION: No acute abnormality noted.   Electronically Signed   By: Alcide CleverMark  Lukens M.D.   On: 05/26/2013 00:29    Medications: I have reviewed the patient's current medications.  Assessment/Plan: #1 Left Great Toe Osteomyelitis: stable awaiting amputation of the distal phalanx versus whole great toe tomorrow morning. Per Dr. Lajoyce Cornersuda he could be ready for discharge to home over the weekend. He would need HHRN as he has nobody to help him with dressing changes. Will also need to continue antibiotics for the less severe infection in the right great toe nailbed.  #2 DM2: stable on insulin #3 Tobacco Abuse: stable on nicotine patch   LOS: 2 days   Jarome MatinDaniel Elleanna Melling 05/27/2013, 12:49 PM

## 2013-05-28 ENCOUNTER — Encounter (HOSPITAL_COMMUNITY)
Admission: AD | Disposition: A | Discharge status: Home / Self Care | Payer: Self-pay | Source: Ambulatory Visit | Attending: Internal Medicine

## 2013-05-28 ENCOUNTER — Encounter (HOSPITAL_COMMUNITY): Payer: Self-pay | Admitting: Certified Registered Nurse Anesthetist

## 2013-05-28 ENCOUNTER — Inpatient Hospital Stay (HOSPITAL_COMMUNITY): Payer: Medicare Other | Admitting: Certified Registered Nurse Anesthetist

## 2013-05-28 ENCOUNTER — Encounter (HOSPITAL_COMMUNITY): Payer: Medicare Other | Admitting: Certified Registered Nurse Anesthetist

## 2013-05-28 HISTORY — PX: AMPUTATION: SHX166

## 2013-05-28 LAB — GLUCOSE, CAPILLARY
GLUCOSE-CAPILLARY: 120 mg/dL — AB (ref 70–99)
GLUCOSE-CAPILLARY: 184 mg/dL — AB (ref 70–99)
Glucose-Capillary: 110 mg/dL — ABNORMAL HIGH (ref 70–99)
Glucose-Capillary: 117 mg/dL — ABNORMAL HIGH (ref 70–99)
Glucose-Capillary: 171 mg/dL — ABNORMAL HIGH (ref 70–99)
Glucose-Capillary: 192 mg/dL — ABNORMAL HIGH (ref 70–99)

## 2013-05-28 LAB — SURGICAL PCR SCREEN
MRSA, PCR: NEGATIVE
Staphylococcus aureus: NEGATIVE

## 2013-05-28 SURGERY — AMPUTATION DIGIT
Anesthesia: General | Laterality: Left

## 2013-05-28 MED ORDER — FENTANYL CITRATE 0.05 MG/ML IJ SOLN
INTRAMUSCULAR | Status: DC | PRN
  Administered 2013-05-28: 25 ug via INTRAVENOUS
  Administered 2013-05-28: 50 ug via INTRAVENOUS
  Administered 2013-05-28: 25 ug via INTRAVENOUS

## 2013-05-28 MED ORDER — ACETAMINOPHEN 325 MG PO TABS
ORAL_TABLET | ORAL | Status: AC
  Filled 2013-05-28: qty 2

## 2013-05-28 MED ORDER — PROPOFOL 10 MG/ML IV BOLUS
INTRAVENOUS | Status: AC
  Filled 2013-05-28: qty 20

## 2013-05-28 MED ORDER — FENTANYL CITRATE 0.05 MG/ML IJ SOLN
INTRAMUSCULAR | Status: AC
  Filled 2013-05-28: qty 5

## 2013-05-28 MED ORDER — MIDAZOLAM HCL 2 MG/2ML IJ SOLN
INTRAMUSCULAR | Status: AC
  Filled 2013-05-28: qty 2

## 2013-05-28 MED ORDER — MIDAZOLAM HCL 5 MG/5ML IJ SOLN
INTRAMUSCULAR | Status: DC | PRN
Start: 2013-05-28 — End: 2013-05-28
  Administered 2013-05-28 (×2): 1 mg via INTRAVENOUS

## 2013-05-28 MED ORDER — ONDANSETRON HCL 4 MG/2ML IJ SOLN
INTRAMUSCULAR | Status: AC
  Filled 2013-05-28: qty 2

## 2013-05-28 MED ORDER — OXYCODONE HCL 5 MG PO TABS
5.0000 mg | ORAL_TABLET | Freq: Once | ORAL | Status: AC | PRN
  Administered 2013-05-28: 5 mg via ORAL

## 2013-05-28 MED ORDER — BUPIVACAINE HCL (PF) 0.25 % IJ SOLN
INTRAMUSCULAR | Status: AC
  Filled 2013-05-28: qty 30

## 2013-05-28 MED ORDER — ONDANSETRON HCL 4 MG/2ML IJ SOLN
INTRAMUSCULAR | Status: DC | PRN
Start: 2013-05-28 — End: 2013-05-28
  Administered 2013-05-28: 4 mg via INTRAVENOUS

## 2013-05-28 MED ORDER — LACTATED RINGERS IV SOLN
INTRAVENOUS | Status: DC | PRN
  Administered 2013-05-28: 07:00:00 via INTRAVENOUS

## 2013-05-28 MED ORDER — BUPIVACAINE HCL (PF) 0.25 % IJ SOLN
INTRAMUSCULAR | Status: DC | PRN
  Administered 2013-05-28: 10 mL

## 2013-05-28 MED ORDER — FENTANYL CITRATE 0.05 MG/ML IJ SOLN: 25.0000 ug | INTRAMUSCULAR | Status: DC | PRN

## 2013-05-28 MED ORDER — PROMETHAZINE HCL 25 MG/ML IJ SOLN: 6.2500 mg | INTRAMUSCULAR | Status: DC | PRN

## 2013-05-28 MED ORDER — OXYCODONE HCL 5 MG/5ML PO SOLN: 5.0000 mg | Freq: Once | ORAL | Status: AC | PRN

## 2013-05-28 MED ORDER — OXYCODONE HCL 5 MG PO TABS
ORAL_TABLET | ORAL | Status: AC
  Administered 2013-05-28: 5 mg via ORAL
  Filled 2013-05-28: qty 1

## 2013-05-28 MED ORDER — BUPIVACAINE HCL (PF) 0.5 % IJ SOLN
INTRAMUSCULAR | Status: DC | PRN
  Administered 2013-05-28: 30 mL via PERINEURAL

## 2013-05-28 SURGICAL SUPPLY — 57 items
BANDAGE GAUZE 4  KLING STR (GAUZE/BANDAGES/DRESSINGS) IMPLANT
BANDAGE GAUZE ELAST BULKY 4 IN (GAUZE/BANDAGES/DRESSINGS) ×2 IMPLANT
BLADE 10 SAFETY STRL DISP (BLADE) ×3 IMPLANT
BLADE AVERAGE 25MMX9MM (BLADE)
BLADE AVERAGE 25X9 (BLADE) IMPLANT
BLADE LONG MED 31MMX9MM (MISCELLANEOUS) ×1
BLADE LONG MED 31X9 (MISCELLANEOUS) ×1 IMPLANT
BLADE MINI RND TIP GREEN BEAV (BLADE) IMPLANT
BNDG CMPR 9X4 STRL LF SNTH (GAUZE/BANDAGES/DRESSINGS) ×1
BNDG COHESIVE 1X5 TAN STRL LF (GAUZE/BANDAGES/DRESSINGS) IMPLANT
BNDG COHESIVE 4X5 TAN STRL (GAUZE/BANDAGES/DRESSINGS) ×2 IMPLANT
BNDG COHESIVE 6X5 TAN STRL LF (GAUZE/BANDAGES/DRESSINGS) IMPLANT
BNDG ESMARK 4X9 LF (GAUZE/BANDAGES/DRESSINGS) ×3 IMPLANT
BNDG GAUZE STRTCH 6 (GAUZE/BANDAGES/DRESSINGS) IMPLANT
CORDS BIPOLAR (ELECTRODE) ×1 IMPLANT
COVER SURGICAL LIGHT HANDLE (MISCELLANEOUS) ×3 IMPLANT
CUFF TOURNIQUET SINGLE 18IN (TOURNIQUET CUFF) IMPLANT
CUFF TOURNIQUET SINGLE 24IN (TOURNIQUET CUFF) IMPLANT
CUFF TOURNIQUET SINGLE 34IN LL (TOURNIQUET CUFF) IMPLANT
DRAPE U-SHAPE 47X51 STRL (DRAPES) ×3 IMPLANT
DURAPREP 26ML APPLICATOR (WOUND CARE) ×1 IMPLANT
ELECT REM PT RETURN 9FT ADLT (ELECTROSURGICAL) ×3
ELECTRODE REM PT RTRN 9FT ADLT (ELECTROSURGICAL) ×1 IMPLANT
GAUZE SPONGE 2X2 8PLY STRL LF (GAUZE/BANDAGES/DRESSINGS) IMPLANT
GAUZE XEROFORM 1X8 LF (GAUZE/BANDAGES/DRESSINGS) ×2 IMPLANT
GLOVE BIO SURGEON STRL SZ8 (GLOVE) ×3 IMPLANT
GLOVE BIOGEL PI IND STRL 8 (GLOVE) ×1 IMPLANT
GLOVE BIOGEL PI INDICATOR 8 (GLOVE) ×2
GLOVE ORTHO TXT STRL SZ7.5 (GLOVE) ×3 IMPLANT
GOWN STRL REUS W/ TWL LRG LVL3 (GOWN DISPOSABLE) ×1 IMPLANT
GOWN STRL REUS W/ TWL XL LVL3 (GOWN DISPOSABLE) ×4 IMPLANT
GOWN STRL REUS W/TWL LRG LVL3 (GOWN DISPOSABLE) ×3
GOWN STRL REUS W/TWL XL LVL3 (GOWN DISPOSABLE) ×12
KIT BASIN OR (CUSTOM PROCEDURE TRAY) ×3 IMPLANT
KIT ROOM TURNOVER OR (KITS) ×3 IMPLANT
MANIFOLD NEPTUNE II (INSTRUMENTS) ×1 IMPLANT
NDL HYPO 25GX1X1/2 BEV (NEEDLE) IMPLANT
NEEDLE HYPO 25GX1X1/2 BEV (NEEDLE) ×3 IMPLANT
NS IRRIG 1000ML POUR BTL (IV SOLUTION) ×3 IMPLANT
PACK ORTHO EXTREMITY (CUSTOM PROCEDURE TRAY) ×3 IMPLANT
PAD ARMBOARD 7.5X6 YLW CONV (MISCELLANEOUS) ×6 IMPLANT
PAD CAST 4YDX4 CTTN HI CHSV (CAST SUPPLIES) IMPLANT
PADDING CAST COTTON 4X4 STRL (CAST SUPPLIES)
SPECIMEN JAR SMALL (MISCELLANEOUS) ×1 IMPLANT
SPONGE GAUZE 2X2 STER 10/PKG (GAUZE/BANDAGES/DRESSINGS)
SPONGE GAUZE 4X4 12PLY (GAUZE/BANDAGES/DRESSINGS) ×2 IMPLANT
SPONGE GAUZE 4X4 12PLY STER LF (GAUZE/BANDAGES/DRESSINGS) ×2 IMPLANT
SUCTION FRAZIER TIP 10 FR DISP (SUCTIONS) ×2 IMPLANT
SUT ETHILON 2 0 FS 18 (SUTURE) IMPLANT
SUT ETHILON 2 0 PSLX (SUTURE) ×2 IMPLANT
SUT VIC AB 2-0 FS1 27 (SUTURE) IMPLANT
SYR CONTROL 10ML LL (SYRINGE) ×2 IMPLANT
TOWEL OR 17X24 6PK STRL BLUE (TOWEL DISPOSABLE) ×3 IMPLANT
TOWEL OR 17X26 10 PK STRL BLUE (TOWEL DISPOSABLE) ×3 IMPLANT
TUBE CONNECTING 12'X1/4 (SUCTIONS) ×1
TUBE CONNECTING 12X1/4 (SUCTIONS) ×1 IMPLANT
WATER STERILE IRR 1000ML POUR (IV SOLUTION) ×1 IMPLANT

## 2013-05-28 NOTE — Brief Op Note (Signed)
05/25/2013 - 05/28/2013  8:12 AM  PATIENT:  Eddie Rasmussen  58 y.o. male  PRE-OPERATIVE DIAGNOSIS:  osteomyelitis left great toe  POST-OPERATIVE DIAGNOSIS:  same  PROCEDURE:  Procedure(s) with comments: AMPUTATION DIGIT; great toe (Left) - would like 0730 start  SURGEON:  Surgeon(s) and Role:    * Kathryne Hitchhristopher Y Hiran Leard, MD - Primary  PHYSICIAN ASSISTANT:   ASSISTANTS: none   ANESTHESIA:   local and regional  EBL:  Total I/O In: 500 [I.V.:500] Out: -   BLOOD ADMINISTERED:none  DRAINS: none   LOCAL MEDICATIONS USED:  NONE  SPECIMEN:  No Specimen  DISPOSITION OF SPECIMEN:  N/A  COUNTS:  YES  TOURNIQUET:  * No tourniquets in log *  DICTATION: .Other Dictation: Dictation Number [id number]  PLAN OF CARE: Admit to inpatient   PATIENT DISPOSITION:  PACU - hemodynamically stable.   Delay start of Pharmacological VTE agent (>24hrs) due to surgical blood loss or risk of bleeding: no

## 2013-05-28 NOTE — Progress Notes (Signed)
Orthopedic Tech Progress Note Patient Details:  Eddie Rasmussen 1955-07-04 [id number]  Ortho Devices Type of Ortho Device: Postop shoe/boot Ortho Device/Splint Interventions: Application   [name] 05/28/2013, 11:22 AM

## 2013-05-28 NOTE — Anesthesia Preprocedure Evaluation (Addendum)
Anesthesia Evaluation  Patient identified by MRN, date of birth, ID band Patient awake    Reviewed: Allergy & Precautions, H&P , NPO status , Patient's Chart, lab work & pertinent test results  History of Anesthesia Complications Negative for: history of anesthetic complications  Airway Mallampati: II TM Distance: >3 FB Neck ROM: Full    Dental  (+) Dental Advisory Given, Missing, Poor Dentition   Pulmonary COPDCurrent Smoker,          Cardiovascular hypertension, + Peripheral Vascular Disease Rhythm:Regular     Neuro/Psych  Headaches, PSYCHIATRIC DISORDERS Anxiety Depression    GI/Hepatic negative GI ROS, Neg liver ROS,   Endo/Other  diabetes, Type 2, Insulin Dependent, Oral Hypoglycemic AgentsMorbid obesity  Renal/GU negative Renal ROS     Musculoskeletal  (+) Arthritis -,   Abdominal   Peds  Hematology  (+) anemia ,   Anesthesia Other Findings   Reproductive/Obstetrics                        Anesthesia Physical Anesthesia Plan  ASA: III  Anesthesia Plan: MAC and Regional   Post-op Pain Management: MAC Combined w/ Regional for Post-op pain   Induction: Intravenous  Airway Management Planned: Simple Face Mask and Natural Airway  Additional Equipment: None  Intra-op Plan:   Post-operative Plan:   Informed Consent: I have reviewed the patients History and Physical, chart, labs and discussed the procedure including the risks, benefits and alternatives for the proposed anesthesia with the patient or authorized representative who has indicated his/her understanding and acceptance.   Dental advisory given  Plan Discussed with: CRNA and Surgeon  Anesthesia Plan Comments:       Anesthesia Quick Evaluation

## 2013-05-28 NOTE — Progress Notes (Signed)
ANTIBIOTIC CONSULT NOTE - Follow-up  Pharmacy Consult for Vancomycin Indication: osteomyelitis  Allergies  Allergen Reactions  . Ibuprofen Nausea Only and Swelling    Patient Measurements: Height: 6\' 1"  (185.4 cm) Weight: 257 lb (116.574 kg) IBW/kg (Calculated) : 79.9  Vital Signs: Temp: 97.5 F (36.4 C) (05/16 0930) Temp src: Oral (05/16 0930) BP: 132/83 mmHg (05/16 0930) Pulse Rate: 82 (05/16 0930) Intake/Output from previous day: 05/15 0701 - 05/16 0700 In: 1544.2 [I.V.:644.2; IV Piggyback:900] Out: -  Intake/Output from this shift: Total I/O In: 500 [I.V.:500] Out: 350 [Urine:350]  Labs:  Recent Labs  05/25/13 1435 05/26/13 0549  WBC 8.5 6.0  HGB 12.4* 12.2*  PLT 234 227  CREATININE 1.00 0.85   Estimated Creatinine Clearance: 128.3 ml/min (by C-G formula based on Cr of 0.85). No results found for this basename: VANCOTROUGH, Leodis BinetVANCOPEAK, VANCORANDOM, GENTTROUGH, GENTPEAK, GENTRANDOM, TOBRATROUGH, TOBRAPEAK, TOBRARND, AMIKACINPEAK, AMIKACINTROU, AMIKACIN,  in the last 72 hours   Microbiology: Recent Results (from the past 720 hour(s))  SURGICAL PCR SCREEN     Status: None   Collection Time    05/28/13  6:21 AM      Result Value Ref Range Status   MRSA, PCR NEGATIVE  NEGATIVE Final   Staphylococcus aureus NEGATIVE  NEGATIVE Final   Comment:            The Xpert SA Assay (FDA     approved for NASAL specimens     in patients over 57 years of age),     is one component of     a comprehensive surveillance     program.  Test performance has     been validated by [facility] for patients greater     than or equal to 57 year old.     It is not intended     to diagnose infection nor to     guide or monitor treatment.   Assessment: 58 yr old male with uncontrolled DMII presented with worsening bilateral foot ulcers. Started on IV vancomycin and zosyn. Now s/p toe amputation today. Planning to discharge tomorrow on doxycycline. Pt is afebrile and WBC is WNL  as of 5/14.   Vanc 5/13>> Zosyn 5/13>>  Goal of Therapy:  Vancomycin trough level 15-20 mcg/ml  Plan:  1. Continue vanc 1gm IV Q12H - holding off on checking a trough since planning DC tomorrow 2. Continue zosyn 3.375gm IV Q8H (4 hr inf) 3. F/u renal fxn, C&S, clinical status and trough if continued  Lysle Pearlachel Aahil Fredin, PharmD, BCPS Pager # [phone] 05/28/2013 11:25 AM

## 2013-05-28 NOTE — Anesthesia Procedure Notes (Addendum)
Procedure Name: MAC Date/Time: 05/28/2013 7:46 AM Performed by: Vita BarleyURNER, SARAH E Pre-anesthesia Checklist: Patient identified, Emergency Drugs available, Suction available and Patient being monitored Patient Re-evaluated:Patient Re-evaluated prior to inductionOxygen Delivery Method: Simple face mask Preoxygenation: Pre-oxygenation with 100% oxygen Intubation Type: IV induction Placement Confirmation: positive ETCO2 and breath sounds checked- equal and bilateral Dental Injury: Teeth and Oropharynx as per pre-operative assessment    Anesthesia Regional Block:  Popliteal block  Pre-Anesthetic Checklist: ,, timeout performed, Correct Patient, Correct Site, Correct Laterality, Correct Procedure, Correct Position, site marked, Risks and benefits discussed,  Surgical consent,  Pre-op evaluation,  At surgeon's request and post-op pain management  Laterality: Lower and Left  Prep: chloraprep       Needles:  Injection technique: Single-shot  Needle Type: Echogenic Stimulator Needle          Additional Needles:  Procedures: ultrasound guided (picture in chart) Popliteal block  Nerve Stimulator or Paresthesia:  Response: plantarflexion, 0.6 mA,   Additional Responses:   Narrative:  Start time: 05/28/2013 7:21 AM End time: 05/28/2013 7:27 AM Injection made incrementally with aspirations every 5 mL.  Performed by: Personally  Anesthesiologist: Amaranta Mehl  Additional Notes: H+P and labs reviewed, risks and benefits discussed with patient, procedure tolerated well without complications

## 2013-05-28 NOTE — Transfer of Care (Signed)
Immediate Anesthesia Transfer of Care Note  Patient: Eddie Rasmussen  Procedure(s) Performed: Procedure(s) with comments: AMPUTATION DIGIT; great toe (Left) - would like 0730 start  Patient Location: PACU  Anesthesia Type:MAC and Regional  Level of Consciousness: awake, alert  and oriented  Airway & Oxygen Therapy: Patient Spontanous Breathing and Patient connected to nasal cannula oxygen  Post-op Assessment: Report given to PACU RN, Post -op Vital signs reviewed and stable and Patient moving all extremities X 4  Post vital signs: Reviewed and stable  Complications: No apparent anesthesia complications

## 2013-05-28 NOTE — Progress Notes (Signed)
Clinical Social Work Department CLINICAL SOCIAL WORK PSYCHIATRY SERVICE LINE ASSESSMENT 05/28/2013  Patient:  Eddie Rasmussen  Account:  [id number]  Zap Date:  05/25/2013  Clinical Social Worker:  Tilden Fossa, Latanya Presser  Date/Time:  05/28/2013 05:47 PM Referred by:  Physician  Date referred:  05/27/2013 Reason for Referral  Behavioral Health Issues   Presenting Symptoms/Problems (In the person's/family's own words):   Patient reports experiencing depression and feelings of guilt related to his disability, his wife's disability, and being his wife's primary caregiver.   Abuse/Neglect/Trauma History (check all that apply)  Denies history   Abuse/Neglect/Trauma Comments:   Psychiatric History (check all that apply)  Inpatient/hospitilization   Psychiatric medications:  Patient states that he takes Zoloft and Zemple Hospitalizations/Previous Mental Health History:   Patient denies having current mental health provider. Reports that he went to [facility] when it was [facility] in the early 1980's for approximately 2 weeks. The hospitalization was precipitated by the end of his first marriage.   Current provider:   Denies   Place and Date:   Current Medications:   Scheduled Meds:      . acetaminophen      . ALPRAZolam  1 mg Oral QID  . amitriptyline  100 mg Oral QHS  . carisoprodol  350 mg Oral QID  . collagenase   Topical Daily  . gabapentin  600 mg Oral TID  . insulin aspart  0-20 Units Subcutaneous TID WC  . insulin aspart  0-5 Units Subcutaneous QHS  . insulin aspart  4 Units Subcutaneous TID WC  . insulin detemir  20 Units Subcutaneous QHS  . morphine  30 mg Oral Q12H  . nicotine  21 mg Transdermal Daily  . piperacillin-tazobactam (ZOSYN)  IV  3.375 g Intravenous Q8H  . senna  1 tablet Oral BID  . sertraline  200 mg Oral Daily  . vancomycin  1,000 mg Intravenous Q12H  . verapamil  80 mg Oral 3 times per day        Continuous Infusions:      .  0.9 % NaCl with KCl 20 mEq / L 10 mL/hr (05/28/13 1543)          PRN Meds:.acetaminophen, acetaminophen, alum & mag hydroxide-simeth, HYDROcodone-acetaminophen, ondansetron (ZOFRAN) IV, ondansetron       Previous Impatient Admission/Date/Reason:   Emotional Health / Current Symptoms    Suicide/Self Harm  None reported   Suicide attempt in the past:   Patient denies   Other harmful behavior:   Patient denies   Psychotic/Dissociative Symptoms  None reported   Other Psychotic/Dissociative Symptoms:    Attention/Behavioral Symptoms  Withdrawn   Other Attention / Behavioral Symptoms:   Patient reports that he tends to withdraw from supports and social situations    Cognitive Impairment  Within Normal Limits   Other Cognitive Impairment:    Mood and Adjustment  Mood Congruent    Stress, Anxiety, Trauma, Any Recent Loss/Stressor  Anxiety  Avoidance  Grief/Loss (recent or history)  Relationship   Anxiety (frequency):   Patient reports that he takes approximately 4 Xanex a day for generalized anxiety.   Phobia (specify):   Compulsive behavior (specify):   Obsessive behavior (specify):   Other:   Patient identified the death of his brother 3 years ago as a significant loss. While he states fishing and playing music to be helpful in coping with stress and relaxing , he often avoids these activities because they remind  him of his deceased brother. Patient also states that his wife's medical condition has been very stressful for both of them (patient states that she is bed-bound and that he is her primary care giver).   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 CSW identified strengths are his faith, family support, and positive attitude.   Clinical Social Worker's Interpretive Summary:   Weekend CSW met with patient to assess for depression. Patient states that he experiences some depression primarily related to his disability and his wife's disability. 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 Patient identified the loss of his brother 3 years ago as a stressor for him. Patient has received psychiatric treament in the early 1980's at [facility]. He states that he does not have a current mental health provider. CSW discussed the benefits of counseling, patient verbalized his interest in trying it. Patient denies SI/HI. CSW provided patient with emotional support and information on local therapists/mental health agencies. Patient thanked CSW for assistance.   Disposition:  Outpatient referral made/needed  Tilden Fossa, MSW, [place] Clinical Social Worker [facility] Emergency Dept. [phone]

## 2013-05-28 NOTE — Anesthesia Postprocedure Evaluation (Signed)
  Anesthesia Post-op Note  Patient: Eddie LeavensJames F Rasmussen  Procedure(s) Performed: Procedure(s) with comments: AMPUTATION DIGIT; great toe (Left) - would like 0730 start  Patient Location: PACU  Anesthesia Type:MAC and Regional  Level of Consciousness: awake and alert   Airway and Oxygen Therapy: Patient Spontanous Breathing  Post-op Pain: none  Post-op Assessment: Post-op Vital signs reviewed, Patient's Cardiovascular Status Stable, Respiratory Function Stable, Patent Airway, No signs of Nausea or vomiting and Pain level controlled  Post-op Vital Signs: Reviewed and stable  Last Vitals:  Filed Vitals:   05/28/13 0845  BP: 134/83  Pulse: 81  Temp: 36.6 C  Resp: 16    Complications: No apparent anesthesia complications

## 2013-05-28 NOTE — Progress Notes (Signed)
Subjective: F/Up Osteomyelitis left great toe S/p L great Toe Amputation under local and regional this am He  will stay today for IV antibiotics and can be discharged to home tomorrow 5/17 on oral antibiotics (doxycycline 100 mg bid). Follow-up c Ortho in 2 weeks All his ?s answered No new issues. He looks fine.  Objective: Vital signs in last 24 hours: Temp:  [97.5 F (36.4 C)-97.9 F (36.6 C)] 97.5 F (36.4 C) (05/16 0930) Pulse Rate:  [78-85] 82 (05/16 0930) Resp:  [11-16] 16 (05/16 0930) BP: (131-155)/(83-97) 132/83 mmHg (05/16 0930) SpO2:  [95 %-99 %] 99 % (05/16 0930) Weight change:    Intake/Output from previous day: 05/15 0701 - 05/16 0700 In: 1544.2 [I.V.:644.2; IV Piggyback:900] Out: -    General appearance: alert, cooperative and no distress Resp: clear to auscultation bilaterally Cardio: regular rate and rhythm GI: soft, non-tender; bowel sounds normal; no masses,  no organomegaly Extremities: decreased erythema and edema of the left leg and foot  Lab Results:  Recent Labs  05/25/13 1435 05/26/13 0549  WBC 8.5 6.0  HGB 12.4* 12.2*  HCT 36.7* 36.2*  PLT 234 227   BMET  Recent Labs  05/25/13 1435 05/26/13 0549  NA 137 138  K 4.9 4.8  CL 99 101  CO2 27 26  GLUCOSE 250* 147*  BUN 15 11  CREATININE 1.00 0.85  CALCIUM 9.0 8.7   CMET CMP     Component Value Date/Time   NA 138 05/26/2013 0549   K 4.8 05/26/2013 0549   CL 101 05/26/2013 0549   CO2 26 05/26/2013 0549   GLUCOSE 147* 05/26/2013 0549   BUN 11 05/26/2013 0549   CREATININE 0.85 05/26/2013 0549   CALCIUM 8.7 05/26/2013 0549   PROT 6.8 05/25/2013 1435   ALBUMIN 3.3* 05/25/2013 1435   AST 12 05/25/2013 1435   ALT 9 05/25/2013 1435   ALKPHOS 101 05/25/2013 1435   BILITOT 0.2* 05/25/2013 1435   GFRNONAA >90 05/26/2013 0549   GFRAA >90 05/26/2013 0549    CBG (last 3)   Recent Labs  05/28/13 0823 05/28/13 0930 05/28/13 1157  GLUCAP 120* 117* 171*    INR RESULTS:   No results found for  this basename: INR,  PROTIME     Studies/Results: Mr Foot Left W Wo Contrast  05/27/2013   CLINICAL DATA:  Toe swelling and erythema. Evaluate for abscess or osteomyelitis.  EXAM: MRI OF THE LEFT FOREFOOT WITHOUT AND WITH CONTRAST  TECHNIQUE: Multiplanar, multisequence MR imaging was performed both before and after administration of intravenous contrast.  CONTRAST:  20mL MULTIHANCE GADOBENATE DIMEGLUMINE 529 MG/ML IV SOLN  COMPARISON:  Radiographs 05/25/2013.  FINDINGS: There is diffuse forefoot soft tissue edema, especially within the dorsal subcutaneous fat. There is mild edema within the flexor musculature, and a small amount of fluid is present within the flexor tendon sheaths. There is no focal or drainable fluid collection.  The distal phalanx of the great toe demonstrates diffuse marrow T2 hyperintensity and enhancement following contrast. T1 weighted images demonstrate possible early cortical thinning but are suboptimal in quality. There is no significant interphalangeal joint effusion. There is no abnormal signal within the proximal phalanx or first metatarsal. The additional toes and metatarsals appear normal. Type 2 accessory navicular noted incidentally.  IMPRESSION: 1. Forefoot cellulitis without evidence of soft tissue abscess. 2. Marrow edema and hyperenhancement within the distal phalanx of the great toe suspicious for early osteomyelitis.   Electronically Signed   By: Roxy HorsemanBill  Veazey  M.D.   On: 05/27/2013 07:42    Medications: I have reviewed the patient's current medications.  Assessment/Plan: #1 Left Great Toe Osteomyelitis:S/p L great Toe Amputation under local and regional this am He  will stay today for IV antibiotics and can be discharged to home tomorrow 5/17 on oral antibiotics (doxycycline 100 mg bid). Follow-up c Ortho in 2 weeks He will need HHRN as he has nobody to help him with dressing changes.  #2 DM2: stable on insulin 110-171 #3 Tobacco Abuse: stable on nicotine patch   #4 HTN #5 PAD Dispo tomorrow?   LOS: 3 days   Gwen PoundsJohn M Mehreen Azizi 05/28/2013, 1:40 PM

## 2013-05-28 NOTE — Progress Notes (Signed)
Patient ID: Eddie Rasmussen, male   DOB: 08-31-1955, [age] y.o.   MRN: [id number] Mr. Kluck's left foot great toe amputation went well.  He can be up as tolerated in a post-op shoe.  He should stay today for IV antibiotics and can be discharged to home tomorrow 5/17 on oral antibiotics (doxycycline 100 mg bid).  Follow-up in 2 weeks.

## 2013-05-28 NOTE — Progress Notes (Signed)
Patient ID: Eddie AcresJames F Dahm, male   DOB: Sep 18, 1955, 58 y.o.   MRN: [id number] Mr. Defina has a left great to ulcer with osteomyelitis confirmed with a MRI.  He understands that the only treatment at this point will be a left foot great toe amputation likely thru the MTP joint.  He gives informed consent for surgery.

## 2013-05-29 LAB — CBC
HCT: 35.2 % — ABNORMAL LOW (ref 39.0–52.0)
HEMOGLOBIN: 11.8 g/dL — AB (ref 13.0–17.0)
MCH: 27.8 pg (ref 26.0–34.0)
MCHC: 33.5 g/dL (ref 30.0–36.0)
MCV: 83 fL (ref 78.0–100.0)
Platelets: 264 10*3/uL (ref 150–400)
RBC: 4.24 MIL/uL (ref 4.22–5.81)
RDW: 13.2 % (ref 11.5–15.5)
WBC: 7.1 10*3/uL (ref 4.0–10.5)

## 2013-05-29 LAB — COMPREHENSIVE METABOLIC PANEL
ALK PHOS: 93 U/L (ref 39–117)
ALT: 13 U/L (ref 0–53)
AST: 17 U/L (ref 0–37)
Albumin: 3.1 g/dL — ABNORMAL LOW (ref 3.5–5.2)
BUN: 13 mg/dL (ref 6–23)
CALCIUM: 8.9 mg/dL (ref 8.4–10.5)
CO2: 27 mEq/L (ref 19–32)
Chloride: 99 mEq/L (ref 96–112)
Creatinine, Ser: 0.81 mg/dL (ref 0.50–1.35)
GFR calc non Af Amer: >90 mL/min (ref >90)
GLUCOSE: 155 mg/dL — AB (ref 70–99)
POTASSIUM: 4 meq/L (ref 3.7–5.3)
Sodium: 139 mEq/L (ref 137–147)
TOTAL PROTEIN: 6.5 g/dL (ref 6.0–8.3)
Total Bilirubin: 0.3 mg/dL (ref 0.3–1.2)

## 2013-05-29 LAB — GLUCOSE, CAPILLARY
Glucose-Capillary: 128 mg/dL — ABNORMAL HIGH (ref 70–99)
Glucose-Capillary: 156 mg/dL — ABNORMAL HIGH (ref 70–99)

## 2013-05-29 MED ORDER — INSULIN DETEMIR 100 UNIT/ML ~~LOC~~ SOLN: 20.0000 [IU] | Freq: Every day | SUBCUTANEOUS | Status: AC

## 2013-05-29 MED ORDER — VERAPAMIL HCL ER 120 MG PO TBCR: 120.0000 mg | EXTENDED_RELEASE_TABLET | Freq: Every day | ORAL | Status: AC

## 2013-05-29 MED ORDER — NICOTINE 21 MG/24HR TD PT24: 21.0000 mg | MEDICATED_PATCH | Freq: Every day | TRANSDERMAL | Status: AC

## 2013-05-29 MED ORDER — DOXYCYCLINE HYCLATE 100 MG PO TABS
100.0000 mg | ORAL_TABLET | Freq: Two times a day (BID) | ORAL | Status: DC
  Administered 2013-05-29: 100 mg via ORAL
  Filled 2013-05-29 (×2): qty 1

## 2013-05-29 MED ORDER — DOXYCYCLINE HYCLATE 100 MG PO TABS: 100.0000 mg | ORAL_TABLET | Freq: Two times a day (BID) | ORAL | Status: DC

## 2013-05-29 NOTE — Progress Notes (Signed)
NURSING PROGRESS NOTE  Eddie AcresJames F Rasmussen [id number] Discharge Data: 05/29/2013 1:24 PM Attending Provider: No att. providers found OZH:[id number],NGEXBMPCP:PATERSON,DANIEL Reece AgarG, MD     Rhona LeavensJames F Artus to be D/C'd Home per MD order.  Discussed with the patient the After Visit Summary and all questions fully answered. All IV's discontinued with no bleeding noted. All belongings returned to patient for patient to take home.   Last Vital Signs:  Blood pressure 131/84, pulse 85, temperature 97.6 F (36.4 C), temperature source Oral, resp. rate 18, height 6\' 1"  (1.854 m), weight 116.574 kg (257 lb), SpO2 96.00%.  Discharge Medication List   Medication List         ALPRAZolam 1 MG tablet  Commonly known as:  XANAX  Take 1 mg by mouth 3 (three) times daily.     amitriptyline 100 MG tablet  Commonly known as:  ELAVIL  Take 100 mg by mouth at bedtime.     carisoprodol 350 MG tablet  Commonly known as:  SOMA  Take 350 mg by mouth 3 (three) times daily.     doxycycline 100 MG tablet  Commonly known as:  VIBRA-TABS  Take 1 tablet (100 mg total) by mouth every 12 (twelve) hours.     gabapentin 300 MG capsule  Commonly known as:  NEURONTIN  Take 600 mg by mouth 3 (three) times daily.     HYDROcodone-acetaminophen 10-325 MG per tablet  Commonly known as:  NORCO  Take 1 tablet by mouth 3 (three) times daily.     insulin detemir 100 UNIT/ML injection  Commonly known as:  LEVEMIR  Inject 0.2 mLs (20 Units total) into the skin at bedtime.     insulin NPH Human 100 UNIT/ML injection  Commonly known as:  HUMULIN N,NOVOLIN N  Inject 30 Units into the skin 2 (two) times daily.     insulin regular 100 units/mL injection  Commonly known as:  NOVOLIN R,HUMULIN R  Inject 5 Units into the skin 2 (two) times daily.     metFORMIN 500 MG tablet  Commonly known as:  GLUCOPHAGE  Take 500 mg by mouth every evening.     morphine 15 MG 12 hr tablet  Commonly known as:  MS CONTIN  Take 15 mg by mouth 3 (three) times daily.     nicotine 21 mg/24hr patch  Commonly known as:  NICODERM CQ - dosed in mg/24 hours  Place 1 patch (21 mg total) onto the skin daily.     sertraline 100 MG tablet  Commonly known as:  ZOLOFT  Take 200 mg by mouth daily.     TYLENOL PO  Take 2 capsules by mouth every 6 (six) hours as needed (for pain).     verapamil 120 MG CR tablet  Commonly known as:  CALAN-SR  Take 1 tablet (120 mg total) by mouth at bedtime.         Cathlyn Parsonsattha Farran Amsden, RN

## 2013-05-29 NOTE — Discharge Summary (Addendum)
Physician Discharge Summary  DISCHARGE SUMMARY   Patient ID: Eddie Rasmussen MR#: [id number] DOB/AGE: 09/19/1955 58 y.o.   Attending Physician:John M Russo  Patient's PCP:PATERSON,DANIEL G, MD  Consults: **  Admit date: 05/25/2013 Discharge date: 05/29/2013  Discharge Diagnoses:  Principal Problem:   Foot ulcer, left Active Problems:   Type II or unspecified type diabetes mellitus with neurological manifestations, not stated as uncontrolled   PVD (peripheral vascular disease)   Tobacco abuse   Depression   Other and unspecified hyperlipidemia   Hyperlipidemia   Abscess of foot including toes   Patient Active Problem List   Diagnosis Date Noted  . Foot ulcer, left 05/25/2013    Class: Acute  . Type II or unspecified type diabetes mellitus with neurological manifestations, not stated as uncontrolled 05/25/2013    Class: Chronic  . PVD (peripheral vascular disease) 05/25/2013    Class: Chronic  . Tobacco abuse 05/25/2013    Class: Chronic  . Depression 05/25/2013    Class: Chronic  . Other and unspecified hyperlipidemia 05/25/2013    Class: Chronic  . Abscess of foot including toes 05/25/2013  . Hyperlipidemia    Past Medical History  Diagnosis Date  . Hypertension   . Peripheral vascular disease   . Depression   . Hyperlipidemia   . Tobacco abuse   . COPD (chronic obstructive pulmonary disease)   . Daily headache   . Arthritis     "all my joints" (05/25/2013)  . Chronic lower back pain   . Anxiety   . Bell's palsy ~ 2000  . Peripheral neuropathy   . Type II diabetes mellitus, uncontrolled     /notes 05/25/2013  . Diabetic foot ulcer admitted 05/25/2013    left  . Poor short term memory     Discharged Condition: good   Discharge Medications:   Medication List         ALPRAZolam 1 MG tablet  Commonly known as:  XANAX  Take 1 mg by mouth 3 (three) times daily.     amitriptyline 100 MG tablet  Commonly known as:  ELAVIL  Take 100 mg by mouth at  bedtime.     carisoprodol 350 MG tablet  Commonly known as:  SOMA  Take 350 mg by mouth 3 (three) times daily.     doxycycline 100 MG tablet  Commonly known as:  VIBRA-TABS  Take 1 tablet (100 mg total) by mouth every 12 (twelve) hours.     gabapentin 300 MG capsule  Commonly known as:  NEURONTIN  Take 600 mg by mouth 3 (three) times daily.     HYDROcodone-acetaminophen 10-325 MG per tablet  Commonly known as:  NORCO  Take 1 tablet by mouth 3 (three) times daily.     insulin detemir 100 UNIT/ML injection  Commonly known as:  LEVEMIR  Inject 0.2 mLs (20 Units total) into the skin at bedtime.     insulin NPH Human 100 UNIT/ML injection  Commonly known as:  HUMULIN N,NOVOLIN N  Inject 30 Units into the skin 2 (two) times daily.     insulin regular 100 units/mL injection  Commonly known as:  NOVOLIN R,HUMULIN R  Inject 5 Units into the skin 2 (two) times daily.     metFORMIN 500 MG tablet  Commonly known as:  GLUCOPHAGE  Take 500 mg by mouth every evening.     morphine 15 MG 12 hr tablet  Commonly known as:  MS CONTIN  Take 15 mg by mouth 3 (  three) times daily.     nicotine 21 mg/24hr patch  Commonly known as:  NICODERM CQ - dosed in mg/24 hours  Place 1 patch (21 mg total) onto the skin daily.     sertraline 100 MG tablet  Commonly known as:  ZOLOFT  Take 200 mg by mouth daily.     TYLENOL PO  Take 2 capsules by mouth every 6 (six) hours as needed (for pain).     verapamil 120 MG CR tablet  Commonly known as:  CALAN-SR  Take 1 tablet (120 mg total) by mouth at bedtime.        Hospital Procedures: X-ray Chest Pa And Lateral   05/26/2013   CLINICAL DATA:  Shortness of breath  EXAM: CHEST  2 VIEW  COMPARISON:  None.  FINDINGS: The heart size and mediastinal contours are within normal limits. Both lungs are clear. The visualized skeletal structures are unremarkable.  IMPRESSION: No active cardiopulmonary disease.   Electronically Signed   By: Inez Catalina M.D.   On:  05/26/2013 00:33   Mr Foot Left W Wo Contrast  05/27/2013   CLINICAL DATA:  Toe swelling and erythema. Evaluate for abscess or osteomyelitis.  EXAM: MRI OF THE LEFT FOREFOOT WITHOUT AND WITH CONTRAST  TECHNIQUE: Multiplanar, multisequence MR imaging was performed both before and after administration of intravenous contrast.  CONTRAST:  22m MULTIHANCE GADOBENATE DIMEGLUMINE 529 MG/ML IV SOLN  COMPARISON:  Radiographs 05/25/2013.  FINDINGS: There is diffuse forefoot soft tissue edema, especially within the dorsal subcutaneous fat. There is mild edema within the flexor musculature, and a small amount of fluid is present within the flexor tendon sheaths. There is no focal or drainable fluid collection.  The distal phalanx of the great toe demonstrates diffuse marrow T2 hyperintensity and enhancement following contrast. T1 weighted images demonstrate possible early cortical thinning but are suboptimal in quality. There is no significant interphalangeal joint effusion. There is no abnormal signal within the proximal phalanx or first metatarsal. The additional toes and metatarsals appear normal. Type 2 accessory navicular noted incidentally.  IMPRESSION: 1. Forefoot cellulitis without evidence of soft tissue abscess. 2. Marrow edema and hyperenhancement within the distal phalanx of the great toe suspicious for early osteomyelitis.   Electronically Signed   By: BCamie PatienceM.D.   On: 05/27/2013 07:42   Dg Foot 2 Views Left  05/26/2013   CLINICAL DATA:  Toe pain  EXAM: LEFT FOOT - 2 VIEW  COMPARISON:  None.  FINDINGS: There is no evidence of fracture or dislocation. There is no evidence of arthropathy or other focal bone abnormality. Soft tissues are unremarkable. An accessory ossicle is noted adjacent to the navicular bone medially. Small bony densities are noted adjacent to the base of the fifth metatarsal. These may be related to prior injury. Similar changes are noted adjacent to the distal fibula.  IMPRESSION:  Chronic changes without acute abnormality.   Electronically Signed   By: MInez CatalinaM.D.   On: 05/26/2013 00:30   Dg Foot 2 Views Right  05/26/2013   CLINICAL DATA:  Toe pain  EXAM: RIGHT FOOT - 2 VIEW  COMPARISON:  None.  FINDINGS: There is no evidence of fracture or dislocation. There is no evidence of arthropathy or other focal bone abnormality. Soft tissues are unremarkable.  IMPRESSION: No acute abnormality noted.   Electronically Signed   By: MInez CatalinaM.D.   On: 05/26/2013 00:29    History of Present Illness: 58year old Caucasian man with multiple  medical problems, most notably diabetes mellitus, type II that is not well controlled on Relion (but was better on Levimir) and is associated with peripheral neuropathy and peripheral vascular disease, medical noncompliance, ongoing heavy tobacco use, and hyperlipidemia. He presented to our office 5/13 complaining of worsening of bilateral foot ulcers. His great toenails have fallen off over the past few weeks and is had drainage from both nailbeds (left more than right). Last week he requested antibiotics and was started on doxycycline. He's noted increasing drainage from the left foot as well as erythema and aching discomfort on the medial left foot and medial distal left leg. Has not had recent fever, chills, or diaphoresis.   Hospital Course: Pt was admitted c DM2 toe ulcers and started on IV Abx. Ortho was consulted and Dr Duda saw pt MRI 5/14: 1. Forefoot cellulitis without evidence of soft tissue abscess.  2. Marrow edema and hyperenhancement within the distal phalanx of the great toe suspicious for early osteomyelitis ABI completed 05/27/13: bilateral ABI within normal limits Pharmacy helped dose Abx = Vanco and Zosyn BP meds started for elevated BPs. After MRI scan came back consistent with osteomyelitis of the left great toe - Dr Duda discussed treatment options and they agreed c proceeding with an amputation of the left great toe at  the MTP joint. Dr. Blackman did the surgery 05/28/13.  Mr Bothun's left foot great toe amputation went well. He can be up as tolerated in a post-op shoe. He should stay 5/16 for IV antibiotics and can be discharged to home 5/17 on oral antibiotics (doxycycline 100 mg bid). Follow-up in 2 Wks.  Dr Blackman left the following recs on the D/c paperwork - You may put your weight on your left foot in your post-op shoe.  Keep your dressing clean and dry.  Leave your current dressing on your left foot for the next 5 days, then you may remove your dressing.  In 5 days, you can get your incision wet in the shower, then place a small amount of ointment daily on your incision followed by a dry dressing.  He is wearing the boot. Pt seen today 5/17 and AVSS. He is doing well. Pain is controlled. sats fine.  All his ?s answered  No new issues.  He looks fine.  He was seen by CSW 5/16 and her note was:  Weekend CSW met with patient to assess for depression. Patient states that he experiences some depression primarily related to his disability and his wife's disability. 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 Patient identified the loss of his brother 3 years ago as a stressor for him. Patient has received psychiatric treament in the early 1980's at [facility]. He states that he does not have a current mental health provider. CSW discussed the benefits of counseling, patient verbalized his interest in trying it. Patient denies SI/HI. CSW provided patient with emotional support and information on local therapists/mental health agencies. Patient thanked CSW for assistance.   Disposition: Outpatient referral made/needed   #1 Left Great Toe Osteomyelitis:S/p L great Toe Amputation under local and regional 5/16  He stayed for 1 more day of IV antibiotics and will be discharged to home today 5/17 on oral antibiotics  (doxycycline 100 mg bid) for 2 more weeks. Follow-up c Ortho in 2 weeks  He may need HHRN as he has nobody to help him   with dressing changes.  #2 DM2: stable on insulin 117-192.  He does much better on the Levimir than the Relion - He will come up with the money to switch back.  He may need $ assistance?  #3 Tobacco Abuse: stable on nicotine patch - called in. #4 HTN - Dr Paterson started him on Calan TID - I will D/c him on Calan SR 120 per day #5 PAD - ASA  #6 Albumin 3.1 = Protein Cal Malnutrition due to Med illness HBgs 11.8-12.4 A1C 8.8 in house TSH 3.370    Day of Discharge Exam BP 131/84  Pulse 85  Temp(Src) 97.6 F (36.4 C) (Oral)  Resp 18  Ht 6' 1" (1.854 m)  Wt 116.574 kg (257 lb)  BMI 33.91 kg/m2  SpO2 96%  Physical Exam: General appearance: alert, cooperative and no distress  Resp: clear to auscultation bilaterally  Cardio: regular rate and rhythm  GI: soft, non-tender; bowel sounds normal; no masses, no organomegaly  Extremities: decreased erythema and edema of the left leg and foot.  LE wrapped and wearing a boot    Discharge Labs:  Recent Labs  05/29/13 0435  NA 139  K 4.0  CL 99  CO2 27  GLUCOSE 155*  BUN 13  CREATININE 0.81  CALCIUM 8.9    Recent Labs  05/29/13 0435  AST 17  ALT 13  ALKPHOS 93  BILITOT 0.3  PROT 6.5  ALBUMIN 3.1*    Recent Labs  05/29/13 0435  WBC 7.1  HGB 11.8*  HCT 35.2*  MCV 83.0  PLT 264   No results found for this basename: CKTOTAL, CKMB, CKMBINDEX, TROPONINI,  in the last 72 hours No results found for this basename: TSH, T4TOTAL, FREET3, T3FREE, THYROIDAB,  in the last 72 hours No results found for this basename: VITAMINB12, FOLATE, FERRITIN, TIBC, IRON, RETICCTPCT,  in the last 72 hours No results found for this basename: INR,  PROTIME       Discharge instructions:   Follow-up Information   Follow up with BLACKMAN,CHRISTOPHER Y, MD. Schedule an appointment as soon as possible for a visit in 2  weeks.   Specialty:  Orthopedic Surgery   Contact information:   [address] [place] [address] [phone]       Follow up with PATERSON,DANIEL G, MD In 4 days.   Specialty:  Internal Medicine   Contact information:   [address] [place] [address] [phone]        Disposition: Home  Follow-up Appts: Follow-up with Dr. Paterson at [facility] within 1 week.  Call for appointment.  Condition on Discharge: stable  Tests Needing Follow-up: BMET/CBC Sugar eval Needs to stay quit from smoking forever.  Time spent in discharge (includes decision making & examination of pt): 40 min  Signed: John M Russo 05/29/2013, 11:30 AM    

## 2013-05-29 NOTE — Progress Notes (Signed)
CARE MANAGEMENT NOTE 05/29/2013  Patient:  Eddie Rasmussen,Eddie Rasmussen   Account Number:  [id number]  Date Initiated:  05/29/2013  Documentation initiated by:  [facility],Toretto Tingler  Subjective/Objective Assessment:   adm: L foot ulcer     Action/Plan:   discharge planning   Anticipated DC Date:  05/29/2013   Anticipated DC Plan:  HOME/SELF CARE      DC Planning Services  CM consult      Choice offered to / List presented to:             Status of service:  Completed, signed off Medicare Important Message given?   (If response is "NO", the following Medicare IM given date fields will be blank) Date Medicare IM given:   Date Additional Medicare IM given:    Discharge Disposition:  HOME/SELF CARE  Per UR Regulation:    If discussed at Long Length of Stay Meetings, dates discussed:    Comments:  05/29/13 12:00 CM received call from discharging RN making sure pt does not need any HH services.  No services have been ordered.  DC summary [facility] changes but CM spoke with pt who refuses HHRN at this time as he states the large dressing is not to be touched until follow up with Dr. Rayburn MaBlackmon in 5 days and the small dressing he can handle at home.  Pt states he has access to a RN at home.  CM called discharging RN to relay the message.  No other CM needs were communicated.  Eddie JakschSarah Ahmad Rasmussen, BSN, CM [phone].

## 2013-05-29 NOTE — Discharge Instructions (Signed)
You may put your weight on your left foot in your post-op shoe. Keep your dressing clean and dry. Leave your current dressing on your left foot for the next 5 days, then you may remove your dressing. In 5 days, you can get your incision wet in the shower, then place a small amount of ointment daily on your incision followed by a dry dressing.

## 2013-05-30 NOTE — Op Note (Signed)
[name], Eddie Rasmussen                   ACCOUNT NO.:  [id number]  MEDICAL RECORD NO.:  [id number]  LOCATION:                                 FACILITY:  PHYSICIAN:  Vanita PandaChristopher Y. Magnus IvanBlackman, M.D.DATE OF BIRTH:  12-08-1955  DATE OF PROCEDURE:  05/28/2013 DATE OF DISCHARGE:  05/29/2013                              OPERATIVE REPORT   PREOPERATIVE DIAGNOSIS:  Left foot great toe infection with cellulitis abscess and osteomyelitis.  POSTOPERATIVE DIAGNOSIS:  Left foot great toe infection with cellulitis abscess and osteomyelitis.  PROCEDURE:  Amputation of left foot great toe through the MTP joint.  SURGEON:  Doneen Poissonhristopher Lindsey Demonte MD.  ANESTHESIA: 1. Regional left lower extremity popliteal block. 2. Local with 0.25% plain Sensorcaine.  ESTIMATED BLOOD LOSS:  Less than 25 mL.  COMPLICATIONS:  None.  INDICATIONS:  [name] is a 58 year old diabetic, who has bilateral great toe wounds.  His left foot great toe wound showed some cellulitis and laceration on the dorsum over a large area.  An MRI was obtained that showed osteomyelitis throughout the proximal and distal phalanx.  It is recommended that he undergo an amputation of the great toe at this point to the MTP joint.  He understands this fully and provided informed consent.  PROCEDURE IN DETAIL:  After informed consent was obtained, appropriate left foot was marked.  Anesthesia was obtained to regional block near the popliteal area.  He was then brought to the operating room, and placed supine on the operating table.  His left foot was then prepped and draped with Betadine scrub and paint.  A time-out was called to identify correct patient, correct left foot.  I then used a local injection around the great toe with 0.25% plain Sensorcaine.  We then used the Esmarch around the ankle as a local tourniquet.  I made an incision around the great toe at the level of the MTP joint.  I was able to pass the toe off the table, and then used  an oscillating saw to cut the bone and then easily removed the rest of the toe down to the MTP joint.  We let the Esmarch down and had good brisk bleeding.  I then irrigated the tissue with normal saline solution.  Hemostasis was obtained with electrocautery and I reapproximated the skin with interrupted 2-0 nylon suture.  Xeroform and a well-padded sterile dressing was applied.  He was taken recovery room in stable condition.  All final counts were correct.  There were no complications noted. Postoperatively, he can weight bear as tolerated on the left with postop shoe and he will stay here 1 more day for IV antibiotics and transitioned to oral antibiotics tomorrow upon discharge.     Vanita Pandahristopher Y. Magnus IvanBlackman, M.D.     CYB/MEDQ  D:  05/28/2013  T:  05/28/2013  Job:  [id number]

## 2013-05-31 ENCOUNTER — Encounter (HOSPITAL_COMMUNITY): Payer: Self-pay | Admitting: Orthopaedic Surgery

## 2013-11-16 ENCOUNTER — Other Ambulatory Visit (HOSPITAL_COMMUNITY): Payer: Self-pay | Admitting: Orthopaedic Surgery

## 2013-11-23 NOTE — Pre-Procedure Instructions (Signed)
Eddie LeavensJames F Rasmussen  11/23/2013   Your procedure is scheduled on: Thursday, December 01, 2013   [facility].   Call this number if you have problems the morning of surgery: [phone]   Remember: DO NOT TAKE ANY DIABETIC MEDICATIONS THE MORNING OF PROCEDURE   Do not eat food or drink liquids after midnight Wednesday, November 30, 2013    Take these medicines the morning of surgery with A SIP OF WATER: ALPRAZolam Prudy Feeler(XANAX), gabapentin (NEURONTIN), HYDROcodone ( Norco), morphine (MS CONTIN), sertraline (ZOLOFT)  If needed:Acetaminophen (TYLENOL) for pain  Stop taking Aspirin, vitamins, verbal medications. Do not take any NSAIDs ie: Ibuprofen, Advil, Naproxen or any medication containing Aspirin; stop 5 days prior to procedure  (Saturday, November 26, 2013).   Do not wear jewelry, make-up or nail polish.  Do not wear lotions, powders, or perfumes. You may not wear deodorant.  Do not shave 48 hours prior to surgery. Men may shave face and neck.  Do not bring valuables to the hospital.  [facility] is not responsible for any belongings or valuables.               Contacts, dentures or bridgework may not be worn into surgery.  Leave suitcase in the car. After surgery it may be brought to your room.  For patients admitted to the hospital, discharge time is determined by your treatment team.               Patients discharged the day of surgery will not be allowed to drive home.   Name and phone number of your driver:   Special Instructions:  Special Instructions:Special Instructions: [facility] - Pre[facility]  Before surgery, you can play an important role.  Because skin is not sterile, your skin needs to be as free of germs as possible.  You can reduce the number of germs on you skin by washing with CHG (chlorahexidine gluconate) soap before surgery.  CHG is an antiseptic cleaner which kills germs and bonds with the skin to continue killing germs  even after washing.  Please DO NOT use if you have an allergy to CHG or antibacterial soaps.  If your skin becomes reddened/irritated stop using the CHG and inform your nurse when you arrive at Short Stay.  Do not shave (including legs and underarms) for at least 48 hours prior to the first CHG shower.  You may shave your face.  Please follow these instructions carefully:   1.  Shower with CHG Soap the night before surgery and the morning of Surgery.  2.  If you choose to wash your hair, wash your hair first as usual with your normal shampoo.  3.  After you shampoo, rinse your hair and body thoroughly to remove the Shampoo.  4.  Use CHG as you would any other liquid soap.  You can apply chg directly  to the skin and wash gently with scrungie or a clean washcloth.  5.  Apply the CHG Soap to your body ONLY FROM THE NECK DOWN.  Do not use on open wounds or open sores.  Avoid contact with your eyes, ears, mouth and genitals (private parts).  Wash genitals (private parts) with your normal soap.  6.  Wash thoroughly, paying special attention to the area where your surgery will be performed.  7.  Thoroughly rinse your body with warm water from the neck down.  8.  DO NOT shower/wash with your normal  soap after using and rinsing off the CHG Soap.  9.  Pat yourself dry with a clean towel.            10.  Wear clean pajamas.            11.  Place clean sheets on your bed the night of your first shower and do not sleep with pets.  Day of Surgery  Do not apply any lotions/deodorants the morning of surgery.  Please wear clean clothes to the hospital/surgery center.   Please read over the following fact sheets that you were given: Pain Booklet, Coughing and Deep Breathing and Surgical Site Infection Prevention

## 2013-11-24 ENCOUNTER — Inpatient Hospital Stay (HOSPITAL_COMMUNITY)
Admission: RE | Admit: 2013-11-24 | Discharge: 2013-11-24 | Disposition: A | Discharge status: Home / Self Care | Payer: Medicare Other | Source: Ambulatory Visit

## 2013-12-01 ENCOUNTER — Ambulatory Visit (HOSPITAL_COMMUNITY): Admission: RE | Admit: 2013-12-01 | Payer: Medicare Other | Source: Ambulatory Visit | Admitting: Orthopaedic Surgery

## 2013-12-01 ENCOUNTER — Encounter (HOSPITAL_COMMUNITY): Admission: RE | Payer: Self-pay | Source: Ambulatory Visit

## 2013-12-01 SURGERY — AMPUTATION DIGIT
Anesthesia: Choice | Laterality: Right

## 2014-02-06 ENCOUNTER — Other Ambulatory Visit (HOSPITAL_COMMUNITY): Payer: Self-pay | Admitting: Orthopaedic Surgery

## 2014-02-08 ENCOUNTER — Encounter (HOSPITAL_COMMUNITY): Payer: Self-pay

## 2014-02-08 ENCOUNTER — Encounter (HOSPITAL_COMMUNITY)
Admission: RE | Admit: 2014-02-08 | Discharge: 2014-02-08 | Disposition: A | Discharge status: Home / Self Care | Payer: Medicare Other | Source: Ambulatory Visit | Attending: Orthopaedic Surgery | Admitting: Orthopaedic Surgery

## 2014-02-08 DIAGNOSIS — Z01812 Encounter for preprocedural laboratory examination: Secondary | ICD-10-CM | POA: Insufficient documentation

## 2014-02-08 DIAGNOSIS — L089 Local infection of the skin and subcutaneous tissue, unspecified: Secondary | ICD-10-CM | POA: Insufficient documentation

## 2014-02-08 HISTORY — DX: Personal history of other infectious and parasitic diseases: Z86.19

## 2014-02-08 HISTORY — DX: Type 2 diabetes mellitus without complications: E11.9

## 2014-02-08 HISTORY — DX: Poor urinary stream: R39.12

## 2014-02-08 HISTORY — DX: Dorsalgia, unspecified: M54.9

## 2014-02-08 HISTORY — DX: Insomnia, unspecified: G47.00

## 2014-02-08 HISTORY — DX: Personal history of other diseases of the respiratory system: Z87.09

## 2014-02-08 HISTORY — DX: Pneumonia, unspecified organism: J18.9

## 2014-02-08 HISTORY — DX: Acute pancreatitis without necrosis or infection, unspecified: K85.90

## 2014-02-08 HISTORY — DX: Other chronic pain: G89.29

## 2014-02-08 LAB — COMPREHENSIVE METABOLIC PANEL
ALT: 14 U/L (ref 0–53)
AST: 20 U/L (ref 0–37)
Albumin: 4.1 g/dL (ref 3.5–5.2)
Alkaline Phosphatase: 83 U/L (ref 39–117)
Anion gap: 5 (ref 5–15)
BILIRUBIN TOTAL: 0.4 mg/dL (ref 0.3–1.2)
BUN: 10 mg/dL (ref 6–23)
CHLORIDE: 102 mmol/L (ref 96–112)
CO2: 30 mmol/L (ref 19–32)
Calcium: 8.8 mg/dL (ref 8.4–10.5)
Creatinine, Ser: 0.96 mg/dL (ref 0.50–1.35)
GFR calc Af Amer: >90 mL/min (ref >90)
Glucose, Bld: 237 mg/dL — ABNORMAL HIGH (ref 70–99)
POTASSIUM: 4.2 mmol/L (ref 3.5–5.1)
Sodium: 137 mmol/L (ref 135–145)
TOTAL PROTEIN: 6.6 g/dL (ref 6.0–8.3)

## 2014-02-08 LAB — CBC
HCT: 40.4 % (ref 39.0–52.0)
HEMOGLOBIN: 14 g/dL (ref 13.0–17.0)
MCH: 28.4 pg (ref 26.0–34.0)
MCHC: 34.7 g/dL (ref 30.0–36.0)
MCV: 81.9 fL (ref 78.0–100.0)
PLATELETS: 158 10*3/uL (ref 150–400)
RBC: 4.93 MIL/uL (ref 4.22–5.81)
RDW: 13.5 % (ref 11.5–15.5)
WBC: 8.1 10*3/uL (ref 4.0–10.5)

## 2014-02-08 LAB — GLUCOSE, CAPILLARY: GLUCOSE-CAPILLARY: 210 mg/dL — AB (ref 70–99)

## 2014-02-08 LAB — HEMOGLOBIN A1C
HEMOGLOBIN A1C: 7.8 % — AB (ref <5.7)
Mean Plasma Glucose: 177 mg/dL — ABNORMAL HIGH (ref <117)

## 2014-02-08 NOTE — Progress Notes (Signed)
   02/08/14 1226  OBSTRUCTIVE SLEEP APNEA  Have you ever been diagnosed with sleep apnea through a sleep study? No  Do you snore loudly (loud enough to be heard through closed doors)?  1  Do you often feel tired, fatigued, or sleepy during the daytime? 0  Has anyone observed you stop breathing during your sleep? 0  Do you have, or are you being treated for high blood pressure? 1  BMI more than 35 kg/m2? 0  Age over 58 years old? 1  Neck circumference greater than 40 cm/16 inches? 1  Gender: 1  Obstructive Sleep Apnea Score 5  Score 4 or greater  Results sent to PCP

## 2014-02-08 NOTE — Progress Notes (Addendum)
EKG in epic from 05-25-13  CXR in epic from 05-26-13  Medical MD is Dr.Daniel Eloise HarmanPaterson  Pt doesn't have a cardiologist  Denies ever having an echo/stress test/heart cath

## 2014-02-08 NOTE — Progress Notes (Signed)
Average fasting blood sugar runs 110

## 2014-02-08 NOTE — Pre-Procedure Instructions (Signed)
Eddie Rasmussen  02/08/2014   Your procedure is scheduled on:  Tues, Feb 2 @ 1:00 PM  [facility]  at 11:00 AM.  Call this number if you have problems the morning of surgery: [phone]   Remember:   Do not eat food or drink liquids after midnight.   Take these medicines the morning of surgery with A SIP OF WATER: Xanax(Alprazolam),Gabapentin(Neurontin),and Pain Pill(if needed)              No Goody's,BC's,Aspirin,Aleve,Ibuprofen,Fish Oil,or any Herbal Medications   Do not wear jewelry  Do not wear lotions, powders, or colognes. You may wear deodorant.  Men may shave face and neck.  Do not bring valuables to the hospital.  [facility] is not responsible                  for any belongings or valuables.               Contacts, dentures or bridgework may not be worn into surgery.  Leave suitcase in the car. After surgery it may be brought to your room.  For patients admitted to the hospital, discharge time is determined by your                treatment team.               Patients discharged the day of surgery will not be allowed to drive  home.    Special Instructions:  [place] - Preparing for Surgery  Before surgery, you can play an important role.  Because skin is not sterile, your skin needs to be as free of germs as possible.  You can reduce the number of germs on you skin by washing with CHG (chlorahexidine gluconate) soap before surgery.  CHG is an antiseptic cleaner which kills germs and bonds with the skin to continue killing germs even after washing.  Please DO NOT use if you have an allergy to CHG or antibacterial soaps.  If your skin becomes reddened/irritated stop using the CHG and inform your nurse when you arrive at Short Stay.  Do not shave (including legs and underarms) for at least 48 hours prior to the first CHG shower.  You may shave your face.  Please follow these instructions carefully:   1.  Shower with CHG Soap the night before surgery  and the                                morning of Surgery.  2.  If you choose to wash your hair, wash your hair first as usual with your       normal shampoo.  3.  After you shampoo, rinse your hair and body thoroughly to remove the                      Shampoo.  4.  Use CHG as you would any other liquid soap.  You can apply chg directly       to the skin and wash gently with scrungie or a clean washcloth.  5.  Apply the CHG Soap to your body ONLY FROM THE NECK DOWN.        Do not use on open wounds or open sores.  Avoid contact with your eyes,       ears, mouth and genitals (private parts).  Wash genitals (private parts)  with your normal soap.  6.  Wash thoroughly, paying special attention to the area where your surgery        will be performed.  7.  Thoroughly rinse your body with warm water from the neck down.  8.  DO NOT shower/wash with your normal soap after using and rinsing off       the CHG Soap.  9.  Pat yourself dry with a clean towel.            10.  Wear clean pajamas.            11.  Place clean sheets on your bed the night of your first shower and do not        sleep with pets.  Day of Surgery  Do not apply any lotions/deoderants the morning of surgery.  Please wear clean clothes to the hospital/surgery center.     Please read over the following fact sheets that you were given: Pain Booklet, Coughing and Deep Breathing and Surgical Site Infection Prevention

## 2014-02-08 NOTE — Pre-Procedure Instructions (Signed)
Eddie Rasmussen  02/08/2014   Your procedure is scheduled on:  February 2 at 1pm  Report to [facility] Admitting at 1100 AM.  Call this number if you have problems the morning of surgery: [phone]   Remember:   Do not eat food or drink liquids after midnight.   Take these medicines the morning of surgery with A SIP OF WATER:alprazolam (Xanax), carisoprodol (soma), gabapentin (Neurotin), Hydrocodone-acetaminophen (Norco) if needed, Morphine (MSIR)  Stop taking Asprin, Aleve, Ibuprofen, BC's, Goody's, Herbal medications, Or Fish Oil   Do not wear jewelry, make-up or nail polish.  Do not wear lotions, powders, or perfumes. You may wear deodorant.  Do not shave 48 hours prior to surgery. Men may shave face and neck.  Do not bring valuables to the hospital.  [facility] is not responsible   for any belongings or valuables.               Contacts, dentures or bridgework may not be worn into surgery.  Leave suitcase in the car. After surgery it may be brought to your room.  For patients admitted to the hospital, discharge time is determined by your treatment team.               Patients discharged the day of surgery will not be allowed to drive home.    Special Instructions: [place] - Preparing for Surgery  Before surgery, you can play an important role.  Because skin is not sterile, your skin needs to be as free of germs as possible.  You can reduce the number of germs on you skin by washing with CHG (chlorahexidine gluconate) soap before surgery.  CHG is an antiseptic cleaner which kills germs and bonds with the skin to continue killing germs even after washing.  Please DO NOT use if you have an allergy to CHG or antibacterial soaps.  If your skin becomes reddened/irritated stop using the CHG and inform your nurse when you arrive at Short Stay.  Do not shave (including legs and underarms) for at least 48 hours prior to the first CHG shower.  You may shave your  face.  Please follow these instructions carefully:   1.  Shower with CHG Soap the night before surgery and the morning of Surgery.  2.  If you choose to wash your hair, wash your hair first as usual with your normal shampoo.  3.  After you shampoo, rinse your hair and body thoroughly to remove the Shampoo.  4.  Use CHG as you would any other liquid soap.  You can apply chg directly  to the skin and wash gently with scrungie or a clean washcloth.  5.  Apply the CHG Soap to your body ONLY FROM THE NECK DOWN.   Do not use on open wounds or open sores.  Avoid contact with your eyes,  ears, mouth and genitals (private parts).  Wash genitals (private parts) with your normal soap.  6.  Wash thoroughly, paying special attention to the area where your surgery will be performed.  7.  Thoroughly rinse your body with warm water from the neck down.  8.  DO NOT shower/wash with your normal soap after using and rinsing off   the CHG Soap.  9.  Pat yourself dry with a clean towel.            10.  Wear clean pajamas.            11.  Place  clean sheets on your bed the night of your first shower and do not sleep with pets.  Day of Surgery  Do not apply any lotions/deoderants the morning of surgery.  Please wear clean clothes to the hospital/surgery center.      Please read over the following fact sheets that you were given: Pain Booklet, Coughing and Deep Breathing and Surgical Site Infection Prevention

## 2014-02-13 MED ORDER — CEFAZOLIN SODIUM-DEXTROSE 2-3 GM-% IV SOLR
2.0000 g | INTRAVENOUS | Status: AC
  Administered 2014-02-14: 2 g via INTRAVENOUS
  Filled 2014-02-13: qty 50

## 2014-02-13 NOTE — Progress Notes (Signed)
Patient aware of surgery time change to 1230 PM and arrival time at 1030 AM.

## 2014-02-14 ENCOUNTER — Encounter (HOSPITAL_COMMUNITY): Payer: Self-pay | Admitting: *Deleted

## 2014-02-14 ENCOUNTER — Encounter (HOSPITAL_COMMUNITY)
Admission: RE | Disposition: A | Discharge status: Home / Self Care | Payer: Self-pay | Source: Ambulatory Visit | Attending: Orthopaedic Surgery

## 2014-02-14 ENCOUNTER — Ambulatory Visit (HOSPITAL_COMMUNITY): Payer: Medicare Other | Admitting: Anesthesiology

## 2014-02-14 ENCOUNTER — Ambulatory Visit (HOSPITAL_COMMUNITY)
Admission: RE | Admit: 2014-02-14 | Discharge: 2014-02-14 | Disposition: A | Discharge status: Home / Self Care | Payer: Medicare Other | Source: Ambulatory Visit | Attending: Orthopaedic Surgery | Admitting: Orthopaedic Surgery

## 2014-02-14 DIAGNOSIS — F419 Anxiety disorder, unspecified: Secondary | ICD-10-CM | POA: Diagnosis not present

## 2014-02-14 DIAGNOSIS — M545 Low back pain: Secondary | ICD-10-CM | POA: Diagnosis not present

## 2014-02-14 DIAGNOSIS — I10 Essential (primary) hypertension: Secondary | ICD-10-CM | POA: Diagnosis not present

## 2014-02-14 DIAGNOSIS — F17210 Nicotine dependence, cigarettes, uncomplicated: Secondary | ICD-10-CM | POA: Insufficient documentation

## 2014-02-14 DIAGNOSIS — L97519 Non-pressure chronic ulcer of other part of right foot with unspecified severity: Secondary | ICD-10-CM

## 2014-02-14 DIAGNOSIS — F329 Major depressive disorder, single episode, unspecified: Secondary | ICD-10-CM | POA: Insufficient documentation

## 2014-02-14 DIAGNOSIS — S91101A Unspecified open wound of right great toe without damage to nail, initial encounter: Secondary | ICD-10-CM | POA: Insufficient documentation

## 2014-02-14 DIAGNOSIS — E11621 Type 2 diabetes mellitus with foot ulcer: Secondary | ICD-10-CM | POA: Insufficient documentation

## 2014-02-14 DIAGNOSIS — Z794 Long term (current) use of insulin: Secondary | ICD-10-CM | POA: Insufficient documentation

## 2014-02-14 DIAGNOSIS — J449 Chronic obstructive pulmonary disease, unspecified: Secondary | ICD-10-CM | POA: Diagnosis not present

## 2014-02-14 DIAGNOSIS — G629 Polyneuropathy, unspecified: Secondary | ICD-10-CM | POA: Diagnosis not present

## 2014-02-14 DIAGNOSIS — Z6833 Body mass index (BMI) 33.0-33.9, adult: Secondary | ICD-10-CM | POA: Insufficient documentation

## 2014-02-14 DIAGNOSIS — F1099 Alcohol use, unspecified with unspecified alcohol-induced disorder: Secondary | ICD-10-CM | POA: Insufficient documentation

## 2014-02-14 DIAGNOSIS — I509 Heart failure, unspecified: Secondary | ICD-10-CM | POA: Diagnosis not present

## 2014-02-14 DIAGNOSIS — G8929 Other chronic pain: Secondary | ICD-10-CM | POA: Diagnosis not present

## 2014-02-14 DIAGNOSIS — I739 Peripheral vascular disease, unspecified: Secondary | ICD-10-CM | POA: Diagnosis not present

## 2014-02-14 DIAGNOSIS — Z886 Allergy status to analgesic agent status: Secondary | ICD-10-CM | POA: Diagnosis not present

## 2014-02-14 HISTORY — PX: AMPUTATION: SHX166

## 2014-02-14 LAB — GLUCOSE, CAPILLARY
GLUCOSE-CAPILLARY: 133 mg/dL — AB (ref 70–99)
GLUCOSE-CAPILLARY: 134 mg/dL — AB (ref 70–99)
Glucose-Capillary: 114 mg/dL — ABNORMAL HIGH (ref 70–99)

## 2014-02-14 SURGERY — AMPUTATION DIGIT
Anesthesia: Monitor Anesthesia Care | Site: Toe | Laterality: Right

## 2014-02-14 MED ORDER — PROPOFOL 10 MG/ML IV BOLUS
INTRAVENOUS | Status: AC
  Filled 2014-02-14: qty 20

## 2014-02-14 MED ORDER — OXYCODONE HCL 5 MG/5ML PO SOLN: 5.0000 mg | Freq: Once | ORAL | Status: DC | PRN

## 2014-02-14 MED ORDER — LACTATED RINGERS IV SOLN
INTRAVENOUS | Status: DC
  Administered 2014-02-14: 50 mL/h via INTRAVENOUS

## 2014-02-14 MED ORDER — FENTANYL CITRATE 0.05 MG/ML IJ SOLN
INTRAMUSCULAR | Status: AC
Start: 2014-02-14 — End: 2014-02-14
  Filled 2014-02-14: qty 5

## 2014-02-14 MED ORDER — FENTANYL CITRATE 0.05 MG/ML IJ SOLN
INTRAMUSCULAR | Status: AC
  Filled 2014-02-14: qty 2

## 2014-02-14 MED ORDER — FENTANYL CITRATE 0.05 MG/ML IJ SOLN
INTRAMUSCULAR | Status: AC
  Filled 2014-02-14: qty 5

## 2014-02-14 MED ORDER — BUPIVACAINE HCL (PF) 0.25 % IJ SOLN
INTRAMUSCULAR | Status: AC
  Filled 2014-02-14: qty 30

## 2014-02-14 MED ORDER — LIDOCAINE HCL (CARDIAC) 20 MG/ML IV SOLN
INTRAVENOUS | Status: DC | PRN
  Administered 2014-02-14: 50 mg via INTRAVENOUS

## 2014-02-14 MED ORDER — GLYCOPYRROLATE 0.2 MG/ML IJ SOLN
INTRAMUSCULAR | Status: AC
  Filled 2014-02-14: qty 2

## 2014-02-14 MED ORDER — FENTANYL CITRATE 0.05 MG/ML IJ SOLN
INTRAMUSCULAR | Status: DC | PRN
  Administered 2014-02-14: 50 ug via INTRAVENOUS

## 2014-02-14 MED ORDER — NEOSTIGMINE METHYLSULFATE 10 MG/10ML IV SOLN
INTRAVENOUS | Status: AC
Start: 2014-02-14 — End: 2014-02-14
  Filled 2014-02-14: qty 1

## 2014-02-14 MED ORDER — ROPIVACAINE HCL 5 MG/ML IJ SOLN
INTRAMUSCULAR | Status: DC | PRN
  Administered 2014-02-14: 15 mL via PERINEURAL

## 2014-02-14 MED ORDER — ACETAMINOPHEN 325 MG PO TABS: 325.0000 mg | ORAL_TABLET | ORAL | Status: DC | PRN

## 2014-02-14 MED ORDER — FENTANYL CITRATE 0.05 MG/ML IJ SOLN: 25.0000 ug | INTRAMUSCULAR | Status: DC | PRN

## 2014-02-14 MED ORDER — LIDOCAINE-EPINEPHRINE (PF) 1.5 %-1:200000 IJ SOLN
INTRAMUSCULAR | Status: DC | PRN
  Administered 2014-02-14: 15 mL via PERINEURAL

## 2014-02-14 MED ORDER — ONDANSETRON HCL 4 MG/2ML IJ SOLN
INTRAMUSCULAR | Status: AC
  Filled 2014-02-14: qty 2

## 2014-02-14 MED ORDER — ACETAMINOPHEN 160 MG/5ML PO SOLN: 325.0000 mg | ORAL | Status: DC | PRN

## 2014-02-14 MED ORDER — PROPOFOL INFUSION 10 MG/ML OPTIME
INTRAVENOUS | Status: DC | PRN
  Administered 2014-02-14: 100 ug/kg/min via INTRAVENOUS

## 2014-02-14 MED ORDER — LIDOCAINE HCL (CARDIAC) 20 MG/ML IV SOLN
INTRAVENOUS | Status: AC
  Filled 2014-02-14: qty 5

## 2014-02-14 MED ORDER — ROCURONIUM BROMIDE 50 MG/5ML IV SOLN
INTRAVENOUS | Status: AC
  Filled 2014-02-14: qty 1

## 2014-02-14 MED ORDER — MIDAZOLAM HCL 2 MG/2ML IJ SOLN
INTRAMUSCULAR | Status: AC
  Filled 2014-02-14: qty 2

## 2014-02-14 MED ORDER — OXYCODONE-ACETAMINOPHEN 5-325 MG PO TABS: 1.0000 | ORAL_TABLET | ORAL | Status: AC | PRN

## 2014-02-14 MED ORDER — 0.9 % SODIUM CHLORIDE (POUR BTL) OPTIME
TOPICAL | Status: DC | PRN
  Administered 2014-02-14: 1000 mL

## 2014-02-14 MED ORDER — MIDAZOLAM HCL 2 MG/2ML IJ SOLN
INTRAMUSCULAR | Status: AC
Start: 2014-02-14 — End: 2014-02-14
  Filled 2014-02-14: qty 2

## 2014-02-14 MED ORDER — MIDAZOLAM HCL 5 MG/5ML IJ SOLN
INTRAMUSCULAR | Status: DC | PRN
  Administered 2014-02-14: 2 mg via INTRAVENOUS

## 2014-02-14 MED ORDER — BUPIVACAINE HCL (PF) 0.25 % IJ SOLN
INTRAMUSCULAR | Status: DC | PRN
  Administered 2014-02-14: 6 mL

## 2014-02-14 MED ORDER — OXYCODONE HCL 5 MG PO TABS: 5.0000 mg | ORAL_TABLET | Freq: Once | ORAL | Status: DC | PRN

## 2014-02-14 SURGICAL SUPPLY — 56 items
BANDAGE GAUZE 4  KLING STR (GAUZE/BANDAGES/DRESSINGS) ×2 IMPLANT
BLADE AVERAGE 25MMX9MM (BLADE)
BLADE AVERAGE 25X9 (BLADE) IMPLANT
BLADE MINI RND TIP GREEN BEAV (BLADE) IMPLANT
BNDG CMPR 9X4 STRL LF SNTH (GAUZE/BANDAGES/DRESSINGS) ×1
BNDG COHESIVE 1X5 TAN STRL LF (GAUZE/BANDAGES/DRESSINGS) ×2 IMPLANT
BNDG COHESIVE 4X5 TAN STRL (GAUZE/BANDAGES/DRESSINGS) ×2 IMPLANT
BNDG COHESIVE 6X5 TAN STRL LF (GAUZE/BANDAGES/DRESSINGS) IMPLANT
BNDG ESMARK 4X9 LF (GAUZE/BANDAGES/DRESSINGS) ×3 IMPLANT
BNDG GAUZE ELAST 4 BULKY (GAUZE/BANDAGES/DRESSINGS) ×2 IMPLANT
BNDG GAUZE STRTCH 6 (GAUZE/BANDAGES/DRESSINGS) IMPLANT
CORDS BIPOLAR (ELECTRODE) ×1 IMPLANT
COVER SURGICAL LIGHT HANDLE (MISCELLANEOUS) ×3 IMPLANT
CUFF TOURNIQUET SINGLE 18IN (TOURNIQUET CUFF) IMPLANT
CUFF TOURNIQUET SINGLE 24IN (TOURNIQUET CUFF) IMPLANT
CUFF TOURNIQUET SINGLE 34IN LL (TOURNIQUET CUFF) IMPLANT
DRAPE U-SHAPE 47X51 STRL (DRAPES) ×3 IMPLANT
DURAPREP 26ML APPLICATOR (WOUND CARE) ×1 IMPLANT
ELECT REM PT RETURN 9FT ADLT (ELECTROSURGICAL) ×3
ELECTRODE REM PT RTRN 9FT ADLT (ELECTROSURGICAL) ×1 IMPLANT
GAUZE SPONGE 2X2 8PLY STRL LF (GAUZE/BANDAGES/DRESSINGS) IMPLANT
GAUZE SPONGE 4X4 12PLY STRL (GAUZE/BANDAGES/DRESSINGS) IMPLANT
GAUZE XEROFORM 1X8 LF (GAUZE/BANDAGES/DRESSINGS) ×2 IMPLANT
GLOVE BIO SURGEON STRL SZ8 (GLOVE) ×1 IMPLANT
GLOVE BIOGEL PI IND STRL 6.5 (GLOVE) IMPLANT
GLOVE BIOGEL PI IND STRL 8 (GLOVE) ×1 IMPLANT
GLOVE BIOGEL PI INDICATOR 6.5 (GLOVE) ×2
GLOVE BIOGEL PI INDICATOR 8 (GLOVE) ×2
GLOVE ORTHO TXT STRL SZ7.5 (GLOVE) ×3 IMPLANT
GLOVE SURG SS PI 6.5 STRL IVOR (GLOVE) ×2 IMPLANT
GOWN STRL REUS W/ TWL LRG LVL3 (GOWN DISPOSABLE) ×1 IMPLANT
GOWN STRL REUS W/ TWL XL LVL3 (GOWN DISPOSABLE) ×4 IMPLANT
GOWN STRL REUS W/TWL LRG LVL3 (GOWN DISPOSABLE)
GOWN STRL REUS W/TWL XL LVL3 (GOWN DISPOSABLE) ×6
KIT BASIN OR (CUSTOM PROCEDURE TRAY) ×3 IMPLANT
KIT ROOM TURNOVER OR (KITS) ×3 IMPLANT
MANIFOLD NEPTUNE II (INSTRUMENTS) ×1 IMPLANT
NDL HYPO 25GX1X1/2 BEV (NEEDLE) IMPLANT
NEEDLE HYPO 25GX1X1/2 BEV (NEEDLE) ×3 IMPLANT
NS IRRIG 1000ML POUR BTL (IV SOLUTION) ×1 IMPLANT
PACK ORTHO EXTREMITY (CUSTOM PROCEDURE TRAY) ×3 IMPLANT
PAD ARMBOARD 7.5X6 YLW CONV (MISCELLANEOUS) ×6 IMPLANT
PAD CAST 4YDX4 CTTN HI CHSV (CAST SUPPLIES) IMPLANT
PADDING CAST COTTON 4X4 STRL (CAST SUPPLIES)
SPECIMEN JAR SMALL (MISCELLANEOUS) ×3 IMPLANT
SPONGE GAUZE 2X2 STER 10/PKG (GAUZE/BANDAGES/DRESSINGS)
SPONGE GAUZE 4X4 12PLY STER LF (GAUZE/BANDAGES/DRESSINGS) ×2 IMPLANT
SUCTION FRAZIER TIP 10 FR DISP (SUCTIONS) ×2 IMPLANT
SUT ETHILON 2 0 FS 18 (SUTURE) ×2 IMPLANT
SUT VIC AB 2-0 FS1 27 (SUTURE) ×2 IMPLANT
SYR CONTROL 10ML LL (SYRINGE) ×2 IMPLANT
TOWEL OR 17X24 6PK STRL BLUE (TOWEL DISPOSABLE) ×3 IMPLANT
TOWEL OR 17X26 10 PK STRL BLUE (TOWEL DISPOSABLE) ×3 IMPLANT
TUBE CONNECTING 12'X1/4 (SUCTIONS) ×1
TUBE CONNECTING 12X1/4 (SUCTIONS) ×1 IMPLANT
WATER STERILE IRR 1000ML POUR (IV SOLUTION) ×1 IMPLANT

## 2014-02-14 NOTE — Discharge Instructions (Signed)
You can put full weight as tolerated on your right foot. Elevation for swelling. Keep your dressings clean and dry. Leave your dressings on for the next 5 days; then you may remove your dressings and start getting your incision wet in the shower daily. In 5 days, you can start getting your incision wet in the shower; then dry it off and put a new dry dressing or large band-aids over your incision daily.  What to eat:  For your first meals, you should eat lightly; only small meals initially.  If you do not have nausea, you may eat larger meals.  Avoid spicy, greasy and heavy food.    General Anesthesia, Adult, Care After  Refer to this sheet in the next few weeks. These instructions provide you with information on caring for yourself after your procedure. Your health care provider may also give you more specific instructions. Your treatment has been planned according to current medical practices, but problems sometimes occur. Call your health care provider if you have any problems or questions after your procedure.  WHAT TO EXPECT AFTER THE PROCEDURE  After the procedure, it is typical to experience:  Sleepiness.  Nausea and vomiting. HOME CARE INSTRUCTIONS  For the first 24 hours after general anesthesia:  Have a responsible person with you.  Do not drive a car. If you are alone, do not take public transportation.  Do not drink alcohol.  Do not take medicine that has not been prescribed by your health care provider.  Do not sign important papers or make important decisions.  You may resume a normal diet and activities as directed by your health care provider.  Change bandages (dressings) as directed.  If you have questions or problems that seem related to general anesthesia, call the hospital and ask for the anesthetist or anesthesiologist on call. SEEK MEDICAL CARE IF:  You have nausea and vomiting that continue the day after anesthesia.  You develop a rash. SEEK IMMEDIATE MEDICAL CARE  IF:  You have difficulty breathing.  You have chest pain.  You have any allergic problems. Document Released: 04/07/2000 Document Revised: 09/01/2012 Document Reviewed: 07/15/2012  [facility] Patient Information 2014 [employer] [place].

## 2014-02-14 NOTE — Progress Notes (Signed)
Called Dr.Moser for sign out. 

## 2014-02-14 NOTE — Brief Op Note (Signed)
02/14/2014  1:26 PM  PATIENT:  Rhona LeavensJames F Witter  59 y.o. male  PRE-OPERATIVE DIAGNOSIS:  Chronic infection right great toe  POST-OPERATIVE DIAGNOSIS:  Chronic infection right great toe  PROCEDURE:  Procedure(s): AMPUTATION RIGHT GREAT TOE (Right)  SURGEON:  Surgeon(s) and Role:    * Kathryne Hitchhristopher Y Blackman, MD - Primary  ANESTHESIA:   local and regional  EBL:  Total I/O In: 700 [I.V.:700] Out: -   BLOOD ADMINISTERED:none  DRAINS: none   LOCAL MEDICATIONS USED:  MARCAINE     SPECIMEN:  No Specimen  DISPOSITION OF SPECIMEN:  N/A  COUNTS:  YES  TOURNIQUET:  * No tourniquets in log *  DICTATION: .Other Dictation: Dictation Number [id number]  PLAN OF CARE: Admit to inpatient   PATIENT DISPOSITION:  PACU - hemodynamically stable.   Delay start of Pharmacological VTE agent (>24hrs) due to surgical blood loss or risk of bleeding: no

## 2014-02-14 NOTE — Progress Notes (Signed)
Orthopedic Tech Progress Note Patient Details:  Eddie Rasmussen Jan 29, 1955 [id number]  Patient ID: Eddie Rasmussen, male   DOB: Jan 29, 1955, 59 y.o.   MRN: [id number] Viewed order from doctor's order list  Nikki DomCrawford, Kartel Wolbert 02/14/2014, 2:00 PM

## 2014-02-14 NOTE — Transfer of Care (Signed)
Immediate Anesthesia Transfer of Care Note  Patient: Eddie Rasmussen  Procedure(s) Performed: Procedure(s): AMPUTATION RIGHT GREAT TOE (Right)  Patient Location: PACU  Anesthesia Type:MAC  Level of Consciousness: awake, alert  and oriented  Airway & Oxygen Therapy: Patient Spontanous Breathing and Patient connected to nasal cannula oxygen  Post-op Assessment: Report given to RN, Post -op Vital signs reviewed and stable and Patient moving all extremities X 4  Post vital signs: Reviewed and stable  Last Vitals:  Filed Vitals:   02/14/14 1328  BP:   Pulse:   Temp: 36.6 C  Resp:     Complications: No apparent anesthesia complications

## 2014-02-14 NOTE — Anesthesia Preprocedure Evaluation (Addendum)
Anesthesia Evaluation  Patient identified by MRN, date of birth, ID band Patient awake    Reviewed: Allergy & Precautions, NPO status , Patient's Chart, lab work & pertinent test results  History of Anesthesia Complications Negative for: history of anesthetic complications  Airway Mallampati: II  TM Distance: >3 FB Neck ROM: Full    Dental  (+) Edentulous Upper, Edentulous Lower   Pulmonary COPDCurrent Smoker,  breath sounds clear to auscultation        Cardiovascular hypertension, Pt. on medications - angina+ Peripheral Vascular Disease - Past MI and - CHF Rhythm:Regular     Neuro/Psych PSYCHIATRIC DISORDERS Anxiety Depression  Neuromuscular disease    GI/Hepatic negative GI ROS, Neg liver ROS,   Endo/Other  diabetes, Poorly Controlled, Type 2, Insulin DependentMorbid obesity  Renal/GU negative Renal ROS     Musculoskeletal  (+) Arthritis -,   Abdominal   Peds  Hematology   Anesthesia Other Findings   Reproductive/Obstetrics                            Anesthesia Physical Anesthesia Plan  ASA: III  Anesthesia Plan: MAC and Regional   Post-op Pain Management: MAC Combined w/ Regional for Post-op pain   Induction: Intravenous  Airway Management Planned: Simple Face Mask  Additional Equipment: None  Intra-op Plan:   Post-operative Plan:   Informed Consent: I have reviewed the patients History and Physical, chart, labs and discussed the procedure including the risks, benefits and alternatives for the proposed anesthesia with the patient or authorized representative who has indicated his/her understanding and acceptance.   Dental advisory given  Plan Discussed with: CRNA and Anesthesiologist  Anesthesia Plan Comments:        Anesthesia Quick Evaluation                                  Anesthesia Evaluation  Patient identified by MRN, date of birth, ID band Patient  awake    Reviewed: Allergy & Precautions, H&P , NPO status , Patient's Chart, lab work & pertinent test results  History of Anesthesia Complications Negative for: history of anesthetic complications  Airway Mallampati: II TM Distance: >3 FB Neck ROM: Full    Dental  (+) Dental Advisory Given, Missing, Poor Dentition   Pulmonary COPDCurrent Smoker,          Cardiovascular hypertension, + Peripheral Vascular Disease Rhythm:Regular     Neuro/Psych  Headaches, PSYCHIATRIC DISORDERS Anxiety Depression    GI/Hepatic negative GI ROS, Neg liver ROS,   Endo/Other  diabetes, Type 2, Insulin Dependent, Oral Hypoglycemic AgentsMorbid obesity  Renal/GU negative Renal ROS     Musculoskeletal  (+) Arthritis -,   Abdominal   Peds  Hematology  (+) anemia ,   Anesthesia Other Findings   Reproductive/Obstetrics                        Anesthesia Physical Anesthesia Plan  ASA: III  Anesthesia Plan: MAC and Regional   Post-op Pain Management: MAC Combined w/ Regional for Post-op pain   Induction: Intravenous  Airway Management Planned: Simple Face Mask and Natural Airway  Additional Equipment: None  Intra-op Plan:   Post-operative Plan:   Informed Consent: I have reviewed the patients History and Physical, chart, labs and discussed the procedure including the risks, benefits and alternatives for the proposed anesthesia with the patient  or authorized representative who has indicated his/her understanding and acceptance.   Dental advisory given  Plan Discussed with: CRNA and Surgeon  Anesthesia Plan Comments:       Anesthesia Quick Evaluation

## 2014-02-14 NOTE — Anesthesia Procedure Notes (Signed)
Anesthesia Regional Block:  Popliteal block  Pre-Anesthetic Checklist: ,, timeout performed, Correct Patient, Correct Site, Correct Laterality, Correct Procedure, Correct Position, site marked, Risks and benefits discussed,  Surgical consent,  Pre-op evaluation,  At surgeon's request and post-op pain management  Laterality: Lower and Right  Prep: chloraprep       Needles:  Injection technique: Single-shot  Needle Type: Echogenic Stimulator Needle          Additional Needles:  Procedures: ultrasound guided (picture in chart) and nerve stimulator Popliteal block  Nerve Stimulator or Paresthesia:  Response: plantarflexion, 0.5 mA,   Additional Responses:   Narrative:  Injection made incrementally with aspirations every 5 mL.  Performed by: Personally   Additional Notes: H+P and labs reviewed, risks and benefits discussed with patient, procedure tolerated well without complications      

## 2014-02-14 NOTE — Progress Notes (Signed)
Orthopedic Tech Progress Note Patient Details:  Stephenie AcresJames F Mandella [birth year] [id number]  Ortho Devices Type of Ortho Device: Postop shoe/boot Ortho Device/Splint Location: rle Ortho Device/Splint Interventions: Application   Biviana Saddler 02/14/2014, 2:00 PM

## 2014-02-14 NOTE — H&P (Signed)
Eddie Rasmussen is an 59 y.o. male.   Chief Complaint:   Right great toe with chronic wound and exposed bone HPI:   59 yo male with a history of diabetes and a chronic open wound with exposed bone on the dorsum of his right great toe.  Has failed conservative treatment and the wound has been unable to heal.  X-rays show breakdown of the bone.  He now presents for a right great toe amputation.  He understands fully the risks and benefits involved and does wish to proceed with surgery.  Past Medical History  Diagnosis Date  . Peripheral vascular disease   . COPD (chronic obstructive pulmonary disease)   . Arthritis     "all my joints" (05/25/2013)  . Chronic lower back pain   . Bell's palsy ~ 2000    hx of  . Peripheral neuropathy   . Diabetic foot ulcer admitted 05/25/2013    left  . Anxiety     takes Xanax daily  . Insomnia     takes Elavil nightly  . Depression     takes Zoloft daily  . Hypertension     was taken off of Verapamil over a yr ago  . Pneumonia     hx of-10+yrs ago  . History of bronchitis     10+yrs ago  . Peripheral neuropathy   . Chronic back pain     stenosis  . Weak urinary stream     seeing Medical Md this afternoon about this  . Diabetes mellitus without complication     takes Humulin R ,Humulin N,and Metformin daily  . History of shingles   . Pancreatitis     hx of    Past Surgical History  Procedure Laterality Date  . Back surgery    . Tonsillectomy    . Cholecystectomy    . Lumbar disc surgery N/A 06/2003    "L4-5"  . Knee arthroscopy Right 09/2002; 12/2002  . Microdiscectomy lumbar Left 08/2003  . Cataract extraction w/ intraocular lens implant Right ?2007  . Multiple tooth extractions      "took most of my teeth out"  . Amputation Left 05/28/2013    Procedure: AMPUTATION DIGIT; great toe;  Surgeon: Kathryne Hitch, MD;  [facility] OR;  Service: Orthopedics;  Laterality: Left;  would like 0730 start  . Esophagogastroduodenoscopy       History reviewed. No pertinent family history. Social History:  reports that he has been smoking Cigarettes.  He has a 84 pack-year smoking history. He has never used smokeless tobacco. He reports that he drinks alcohol. He reports that he does not use illicit drugs.  Allergies:  Allergies  Allergen Reactions  . Ibuprofen Nausea Only and Swelling    Medications Prior to Admission  Medication Sig Dispense Refill  . acetaminophen (TYLENOL) 325 MG tablet Take 650 mg by mouth every 6 (six) hours as needed for headache (pain).    Marland Kitchen ALPRAZolam (XANAX) 1 MG tablet Take 1 mg by mouth 4 (four) times daily.     Marland Kitchen amitriptyline (ELAVIL) 100 MG tablet Take 100 mg by mouth at bedtime.    . carisoprodol (SOMA) 350 MG tablet Take 350 mg by mouth 4 (four) times daily.     Marland Kitchen gabapentin (NEURONTIN) 300 MG capsule Take 600 mg by mouth 3 (three) times daily.    Marland Kitchen HYDROcodone-acetaminophen (NORCO) 10-325 MG per tablet Take 1 tablet by mouth 3 (three) times daily.    . insulin NPH  Human (HUMULIN N,NOVOLIN N) 100 UNIT/ML injection Inject 35 Units into the skin 2 (two) times daily.     . insulin regular (NOVOLIN R,HUMULIN R) 100 units/mL injection Inject 5 Units into the skin 2 (two) times daily with a meal.     . metFORMIN (GLUCOPHAGE) 500 MG tablet Take 500 mg by mouth every evening.    Marland Kitchen. morphine (MSIR) 15 MG tablet Take 15 mg by mouth 3 (three) times daily.    . mupirocin ointment (BACTROBAN) 2 % Apply 1 application topically 2 (two) times daily. Apply to toes    . sertraline (ZOLOFT) 100 MG tablet Take 200 mg by mouth at bedtime.     . insulin detemir (LEVEMIR) 100 UNIT/ML injection Inject 0.2 mLs (20 Units total) into the skin at bedtime. (Patient not taking: Reported on 11/22/2013) 10 mL 11  . nicotine (NICODERM CQ - DOSED IN MG/24 HOURS) 21 mg/24hr patch Place 1 patch (21 mg total) onto the skin daily. (Patient not taking: Reported on 11/24/2013) 28 patch 0  . verapamil (CALAN-SR) 120 MG CR tablet  Take 1 tablet (120 mg total) by mouth at bedtime. (Patient not taking: Reported on 11/22/2013) 30 tablet 1    Results for orders placed or performed during the hospital encounter of 02/14/14 (from the past 48 hour(s))  Glucose, capillary     Status: Abnormal   Collection Time: 02/14/14 10:00 AM  Result Value Ref Range   Glucose-Capillary 133 (H) 70 - 99 mg/dL  Glucose, capillary     Status: Abnormal   Collection Time: 02/14/14 12:05 PM  Result Value Ref Range   Glucose-Capillary 134 (H) 70 - 99 mg/dL   No results found.  Review of Systems  All other systems reviewed and are negative.   Blood pressure 169/98, pulse 91, temperature 97.7 F (36.5 C), temperature source Oral, resp. rate 16, height 6\' 1"  (1.854 m), weight 114.306 kg (252 lb), SpO2 99 %. Physical Exam  Constitutional: He is oriented to person, place, and time. He appears well-developed and well-nourished.  HENT:  Head: Normocephalic and atraumatic.  Eyes: EOM are normal. Pupils are equal, round, and reactive to light.  Neck: Normal range of motion. Neck supple.  Cardiovascular: Normal rate and regular rhythm.   Respiratory: Effort normal and breath sounds normal.  GI: Soft. Bowel sounds are normal.  Musculoskeletal:       Feet:  Neurological: He is alert and oriented to person, place, and time.  Skin: Skin is warm and dry.  Psychiatric: He has a normal mood and affect.     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  Kathryne HitchBLACKMAN,Yaacov Koziol Y 02/14/2014, 12:29 PM

## 2014-02-15 NOTE — Op Note (Signed)
Eddie Rasmussen:  Eddie Rasmussen, Eddie Rasmussen                   ACCOUNT NO.:  [id number]  MEDICAL RECORD NO.:  [id number]  LOCATION:                                 FACILITY:  PHYSICIAN:  Vanita PandaChristopher Y. Magnus IvanBlackman, M.D.DATE OF BIRTH:  1955-01-21  DATE OF PROCEDURE:  02/14/2014 DATE OF DISCHARGE:  02/14/2014                              OPERATIVE REPORT   PREOPERATIVE DIAGNOSIS:  Chronic nonhealing wound over the right great toe distal phalanx with x-ray changes consistent with osteomyelitis.  POSTOPERATIVE DIAGNOSIS:  Chronic nonhealing wound over the right great toe distal phalanx with x-ray changes consistent with osteomyelitis.  PROCEDURE:  Right great toe amputation to the MTP joint.  SURGEON:  Vanita PandaChristopher Y. Magnus IvanBlackman, M.D.  ANESTHESIA: 1. Popliteal block on the right leg. 2. Local with 0.25% plain Marcaine.  ANTIBIOTICS:  2 g IV Ancef.  BLOOD LOSS:  Less than 50 mL.  COMPLICATIONS:  None.  INDICATIONS:  Eddie Rasmussen is a [age] year old gentleman diabetic who is well known to me.  He has had a previous left great toe amputation that I did several years ago and he has done well with that.  His right foot has done well for a long period of time but he developed a chronic wound on the dorsum of his foot after a nail bed and nail injury.  He has tried to treat this himself for about a year more and the wound became chronically draining.  X-rays when I saw him in the office several months ago showed bony destruction was concerned about osteomyelitis and wanted to set up for surgery due to family medical issues with his wife, he could keep this a little bit longer but wanted to be set up for surgery for today finally.  He totally understands the risks and benefits of the surgery and does understand it.  Keeping his blood glucose under control and staying off smoking is going to help.  He again did well with his other foot and he does wish to proceed with surgery today.  PROCEDURE DESCRIPTION:  After informed  consent was obtained, appropriate right foot was marked.  Anesthesia was obtained, a popliteal block in the holding area.  We then brought him back to the operating room, and they just then even after provided much sedation at all, we prepped the right foot with Betadine scrub and paint up to the right calf.  Sterile drapes were applied as well.  A time-out was called to identify correct patient, correct right foot.  I then used a towel around the ankle and then Esmarch as a local tourniquet.  I did a fishmouth type of incision around the great toe going back to the MTP joint.  I was able to pass the great toe off in its entirety.  I then identified the neurovascular bundles and did cauterize these.  I let the Esmarch down from his ankle and I was pleased with the brisk bleeding that I saw.  The soft tissues itself looked good otherwise.  I was able to loosely reapproximate the skin with a nice padding over the first metatarsal in the area of the foot.  I easily reapproximated this with  interrupted #2-0 nylon after irrigating thoroughly with normal saline solution.  I then infiltrated the incision locally with 0.25% plain Marcaine.  Xeroform and well- padded sterile dressing were applied.  He was taken to recovery room in stable condition.  Postoperatively, I will have him to weightbear as tolerated in a postoperative shoe.  I will let him dressing instructions and will follow up in office as well.     Vanita Panda. Magnus Ivan, M.D.     CYB/MEDQ  D:  02/14/2014  T:  02/15/2014  Job:  [id number]

## 2014-02-15 NOTE — Anesthesia Postprocedure Evaluation (Signed)
  Anesthesia Post-op Note  Patient: Eddie Rasmussen  Procedure(s) Performed: Procedure(s): AMPUTATION RIGHT GREAT TOE (Right)  Patient Location: PACU  Anesthesia Type:Regional  Level of Consciousness: awake and alert   Airway and Oxygen Therapy: Patient Spontanous Breathing  Post-op Pain: none  Post-op Assessment: Post-op Vital signs reviewed, Patient's Cardiovascular Status Stable, Respiratory Function Stable, Patent Airway, No signs of Nausea or vomiting and Pain level controlled  Post-op Vital Signs: Reviewed and stable  Last Vitals:  Filed Vitals:   02/14/14 1400  BP: 156/89  Pulse: 92  Temp:   Resp: 15    Complications: No apparent anesthesia complications

## 2014-02-16 ENCOUNTER — Encounter (HOSPITAL_COMMUNITY): Payer: Self-pay | Admitting: Orthopaedic Surgery

## 2014-06-09 ENCOUNTER — Emergency Department (HOSPITAL_COMMUNITY)
Admission: EM | Admit: 2014-06-09 | Discharge: 2014-06-09 | Discharge status: Against Medical Advice | Payer: Medicare Other | Attending: Emergency Medicine | Admitting: Emergency Medicine

## 2014-06-09 ENCOUNTER — Encounter (HOSPITAL_COMMUNITY): Payer: Self-pay | Admitting: Emergency Medicine

## 2014-06-09 DIAGNOSIS — E119 Type 2 diabetes mellitus without complications: Secondary | ICD-10-CM | POA: Diagnosis not present

## 2014-06-09 DIAGNOSIS — Z8719 Personal history of other diseases of the digestive system: Secondary | ICD-10-CM | POA: Insufficient documentation

## 2014-06-09 DIAGNOSIS — Z720 Tobacco use: Secondary | ICD-10-CM | POA: Diagnosis not present

## 2014-06-09 DIAGNOSIS — Z792 Long term (current) use of antibiotics: Secondary | ICD-10-CM | POA: Insufficient documentation

## 2014-06-09 DIAGNOSIS — J449 Chronic obstructive pulmonary disease, unspecified: Secondary | ICD-10-CM | POA: Insufficient documentation

## 2014-06-09 DIAGNOSIS — Z8739 Personal history of other diseases of the musculoskeletal system and connective tissue: Secondary | ICD-10-CM | POA: Insufficient documentation

## 2014-06-09 DIAGNOSIS — T364X5A Adverse effect of tetracyclines, initial encounter: Secondary | ICD-10-CM | POA: Diagnosis not present

## 2014-06-09 DIAGNOSIS — G4700 Insomnia, unspecified: Secondary | ICD-10-CM | POA: Diagnosis not present

## 2014-06-09 DIAGNOSIS — G629 Polyneuropathy, unspecified: Secondary | ICD-10-CM | POA: Diagnosis not present

## 2014-06-09 DIAGNOSIS — F329 Major depressive disorder, single episode, unspecified: Secondary | ICD-10-CM | POA: Diagnosis not present

## 2014-06-09 DIAGNOSIS — Z8619 Personal history of other infectious and parasitic diseases: Secondary | ICD-10-CM | POA: Diagnosis not present

## 2014-06-09 DIAGNOSIS — I10 Essential (primary) hypertension: Secondary | ICD-10-CM | POA: Diagnosis not present

## 2014-06-09 DIAGNOSIS — Z794 Long term (current) use of insulin: Secondary | ICD-10-CM | POA: Insufficient documentation

## 2014-06-09 DIAGNOSIS — R112 Nausea with vomiting, unspecified: Secondary | ICD-10-CM | POA: Insufficient documentation

## 2014-06-09 DIAGNOSIS — Z79891 Long term (current) use of opiate analgesic: Secondary | ICD-10-CM | POA: Diagnosis not present

## 2014-06-09 DIAGNOSIS — G8929 Other chronic pain: Secondary | ICD-10-CM | POA: Insufficient documentation

## 2014-06-09 DIAGNOSIS — Z79899 Other long term (current) drug therapy: Secondary | ICD-10-CM | POA: Insufficient documentation

## 2014-06-09 DIAGNOSIS — R197 Diarrhea, unspecified: Secondary | ICD-10-CM | POA: Diagnosis not present

## 2014-06-09 DIAGNOSIS — Z8701 Personal history of pneumonia (recurrent): Secondary | ICD-10-CM | POA: Diagnosis not present

## 2014-06-09 DIAGNOSIS — F419 Anxiety disorder, unspecified: Secondary | ICD-10-CM | POA: Diagnosis not present

## 2014-06-09 LAB — COMPREHENSIVE METABOLIC PANEL
ALT: 17 U/L (ref 17–63)
ANION GAP: 14 (ref 5–15)
AST: 21 U/L (ref 15–41)
Albumin: 3.9 g/dL (ref 3.5–5.0)
Alkaline Phosphatase: 93 U/L (ref 38–126)
BUN: 14 mg/dL (ref 6–20)
CALCIUM: 8.6 mg/dL — AB (ref 8.9–10.3)
CO2: 22 mmol/L (ref 22–32)
Chloride: 98 mmol/L — ABNORMAL LOW (ref 101–111)
Creatinine, Ser: 0.83 mg/dL (ref 0.61–1.24)
GFR calc Af Amer: >60 mL/min (ref >60)
GLUCOSE: 242 mg/dL — AB (ref 65–99)
Potassium: 3.9 mmol/L (ref 3.5–5.1)
Sodium: 134 mmol/L — ABNORMAL LOW (ref 135–145)
TOTAL PROTEIN: 6.9 g/dL (ref 6.5–8.1)
Total Bilirubin: 0.4 mg/dL (ref 0.3–1.2)

## 2014-06-09 LAB — CBC WITH DIFFERENTIAL/PLATELET
BASOS ABS: 0 10*3/uL (ref 0.0–0.1)
Basophils Relative: 0 % (ref 0–1)
EOS ABS: 0 10*3/uL (ref 0.0–0.7)
EOS PCT: 0 % (ref 0–5)
HCT: 39.3 % (ref 39.0–52.0)
HEMOGLOBIN: 13.4 g/dL (ref 13.0–17.0)
LYMPHS ABS: 0.6 10*3/uL — AB (ref 0.7–4.0)
Lymphocytes Relative: 13 % (ref 12–46)
MCH: 27.8 pg (ref 26.0–34.0)
MCHC: 34.1 g/dL (ref 30.0–36.0)
MCV: 81.5 fL (ref 78.0–100.0)
Monocytes Absolute: 0.3 10*3/uL (ref 0.1–1.0)
Monocytes Relative: 6 % (ref 3–12)
NEUTROS PCT: 81 % — AB (ref 43–77)
Neutro Abs: 3.4 10*3/uL (ref 1.7–7.7)
PLATELETS: 131 10*3/uL — AB (ref 150–400)
RBC: 4.82 MIL/uL (ref 4.22–5.81)
RDW: 13.2 % (ref 11.5–15.5)
WBC: 4.2 10*3/uL (ref 4.0–10.5)

## 2014-06-09 LAB — LIPASE, BLOOD: Lipase: 16 U/L — ABNORMAL LOW (ref 22–51)

## 2014-06-09 LAB — MAGNESIUM: Magnesium: 1.6 mg/dL — ABNORMAL LOW (ref 1.7–2.4)

## 2014-06-09 MED ORDER — RANITIDINE HCL 150 MG/10ML PO SYRP
300.0000 mg | ORAL_SOLUTION | Freq: Once | ORAL | Status: DC
  Filled 2014-06-09: qty 20

## 2014-06-09 MED ORDER — SODIUM CHLORIDE 0.9 % IV BOLUS (SEPSIS)
1000.0000 mL | Freq: Once | INTRAVENOUS | Status: AC
  Administered 2014-06-09: 1000 mL via INTRAVENOUS

## 2014-06-09 MED ORDER — ALUM & MAG HYDROXIDE-SIMETH 200-200-20 MG/5ML PO SUSP: 30.0000 mL | Freq: Once | ORAL | Status: DC

## 2014-06-09 MED ORDER — ONDANSETRON HCL 4 MG/2ML IJ SOLN
4.0000 mg | Freq: Once | INTRAMUSCULAR | Status: DC
  Filled 2014-06-09: qty 2

## 2014-06-09 MED ORDER — METOCLOPRAMIDE HCL 5 MG/ML IJ SOLN
10.0000 mg | Freq: Once | INTRAMUSCULAR | Status: AC
  Administered 2014-06-09: 10 mg via INTRAVENOUS
  Filled 2014-06-09: qty 2

## 2014-06-09 MED ORDER — FAMOTIDINE IN NACL 20-0.9 MG/50ML-% IV SOLN
20.0000 mg | Freq: Once | INTRAVENOUS | Status: DC
  Filled 2014-06-09: qty 50

## 2014-06-09 NOTE — ED Notes (Signed)
RN was about to administer meds pt states he wanted to be unhooked; pt states he has meds at home that he needs to take however he has not been able to keep them down; pt states he no longer wanted to stay for tx; Pt's IV dc'd; RN asked pt to wait to speak with Md but pt refused and walked out of room to exit; Md notified;

## 2014-06-09 NOTE — ED Provider Notes (Signed)
CSN: [id number]     Arrival date & time 06/09/14  0415 History   First MD Initiated Contact with Patient 06/09/14 [phone]     Chief Complaint  Patient presents with  . Nausea  . Emesis  . Diarrhea     (Consider location/radiation/quality/duration/timing/severity/associated sxs/prior Treatment) HPI  Eddie Rasmussen is a 59 y.o. male with past medical history of hypertension, diabetes, presenting today with abdominal pain for the past 4-5 days. Patient states 4 days ago he was prescribed an antibiotic-coated for multiple sores on his extremities. This anti-biotic was doxycycline. Since then he has had abdominal cramping, nausea and vomiting. He cannot hold anything down. Patient has had diarrhea as well. He denies ever having these symptoms in the past. He also states he ate a cheeseburger from [employer] on that day. He thinks this may be food poisoning. He denies fevers or chills. He has no further complaints.  10 Systems reviewed and are negative for acute change except as noted in the HPI.      Past Medical History  Diagnosis Date  . Peripheral vascular disease   . COPD (chronic obstructive pulmonary disease)   . Arthritis     "all my joints" (05/25/2013)  . Chronic lower back pain   . Bell's palsy ~ 2000    hx of  . Peripheral neuropathy   . Diabetic foot ulcer admitted 05/25/2013    left  . Anxiety     takes Xanax daily  . Insomnia     takes Elavil nightly  . Depression     takes Zoloft daily  . Hypertension     was taken off of Verapamil over a yr ago  . Pneumonia     hx of-10+yrs ago  . History of bronchitis     10+yrs ago  . Peripheral neuropathy   . Chronic back pain     stenosis  . Weak urinary stream     seeing Medical Md this afternoon about this  . Diabetes mellitus without complication     takes Humulin R ,Humulin N,and Metformin daily  . History of shingles   . Pancreatitis     hx of   Past Surgical History  Procedure Laterality Date  . Back surgery    .  Tonsillectomy    . Cholecystectomy    . Lumbar disc surgery N/A 06/2003    "L4-5"  . Knee arthroscopy Right 09/2002; 12/2002  . Microdiscectomy lumbar Left 08/2003  . Cataract extraction w/ intraocular lens implant Right ?2007  . Multiple tooth extractions      "took most of my teeth out"  . Amputation Left 05/28/2013    Procedure: AMPUTATION DIGIT; great toe;  Surgeon: Kathryne Hitchhristopher Y Blackman, MD;  [facility];  Service: Orthopedics;  Laterality: Left;  would like 0730 start  . Esophagogastroduodenoscopy    . Amputation Right 02/14/2014    Procedure: AMPUTATION RIGHT GREAT TOE;  Surgeon: Kathryne Hitchhristopher Y Blackman, MD;  [facility];  Service: Orthopedics;  Laterality: Right;   History reviewed. No pertinent family history. History  Substance Use Topics  . Smoking status: Current Every Day Smoker -- 2.00 packs/day for 42 years    Types: Cigarettes  . Smokeless tobacco: Never Used  . Alcohol Use: Yes     Comment: "stopped drinking ~ 2000"    Review of Systems    Allergies  Ibuprofen  Home Medications   Prior to Admission medications   Medication Sig Start Date End Date Taking? Authorizing  Provider  acetaminophen (TYLENOL) 325 MG tablet Take 650 mg by mouth every 6 (six) hours as needed for headache (pain).    Historical Provider, MD  ALPRAZolam Prudy Feeler) 1 MG tablet Take 1 mg by mouth 4 (four) times daily.     Historical Provider, MD  amitriptyline (ELAVIL) 100 MG tablet Take 100 mg by mouth at bedtime.    Historical Provider, MD  carisoprodol (SOMA) 350 MG tablet Take 350 mg by mouth 4 (four) times daily.     Historical Provider, MD  gabapentin (NEURONTIN) 300 MG capsule Take 600 mg by mouth 3 (three) times daily.    Historical Provider, MD  insulin detemir (LEVEMIR) 100 UNIT/ML injection Inject 0.2 mLs (20 Units total) into the skin at bedtime. Patient not taking: Reported on 11/22/2013 05/29/13   Creola Corn, MD  insulin NPH Human (HUMULIN N,NOVOLIN N) 100 UNIT/ML injection  Inject 35 Units into the skin 2 (two) times daily.     Historical Provider, MD  insulin regular (NOVOLIN R,HUMULIN R) 100 units/mL injection Inject 5 Units into the skin 2 (two) times daily with a meal.     Historical Provider, MD  metFORMIN (GLUCOPHAGE) 500 MG tablet Take 500 mg by mouth every evening.    Historical Provider, MD  morphine (MSIR) 15 MG tablet Take 15 mg by mouth 3 (three) times daily.    Historical Provider, MD  mupirocin ointment (BACTROBAN) 2 % Apply 1 application topically 2 (two) times daily. Apply to toes    Historical Provider, MD  nicotine (NICODERM CQ - DOSED IN MG/24 HOURS) 21 mg/24hr patch Place 1 patch (21 mg total) onto the skin daily. Patient not taking: Reported on 11/24/2013 05/29/13   Creola Corn, MD  oxyCODONE-acetaminophen (ROXICET) 5-325 MG per tablet Take 1-2 tablets by mouth every 4 (four) hours as needed for severe pain. 02/14/14   Kathryne Hitch, MD  sertraline (ZOLOFT) 100 MG tablet Take 200 mg by mouth at bedtime.     Historical Provider, MD  verapamil (CALAN-SR) 120 MG CR tablet Take 1 tablet (120 mg total) by mouth at bedtime. Patient not taking: Reported on 11/22/2013 05/29/13   Creola Corn, MD   BP 161/94 mmHg  Temp(Src) 97.9 F (36.6 C) (Oral)  Resp 15  Ht  (1.88 m)  Wt 247 lb (112.038 kg)  BMI 31.70 kg/m2  SpO2 99% Physical Exam  Constitutional: He is oriented to person, place, and time. Vital signs are normal. He appears well-developed and well-nourished.  Non-toxic appearance. He does not appear ill. No distress.  HENT:  Head: Normocephalic and atraumatic.  Nose: Nose normal.  Mouth/Throat: Oropharynx is clear and moist. No oropharyngeal exudate.  Eyes: Conjunctivae and EOM are normal. Pupils are equal, round, and reactive to light. No scleral icterus.  Neck: Normal range of motion. Neck supple. No tracheal deviation, no edema, no erythema and normal range of motion present. No thyroid mass and no thyromegaly present.   Cardiovascular: Normal rate, regular rhythm, S1 normal, S2 normal, normal heart sounds, intact distal pulses and normal pulses.  Exam reveals no gallop and no friction rub.   No murmur heard. Pulses:      Radial pulses are 2+ on the right side, and 2+ on the left side.       Dorsalis pedis pulses are 2+ on the right side, and 2+ on the left side.  Pulmonary/Chest: Effort normal and breath sounds normal. No respiratory distress. He has no wheezes. He has no rhonchi. He has  no rales.  Abdominal: Soft. Normal appearance and bowel sounds are normal. He exhibits no distension, no ascites and no mass. There is no hepatosplenomegaly. There is no tenderness. There is no rebound, no guarding and no CVA tenderness.  Musculoskeletal: Normal range of motion. He exhibits no edema or tenderness.  Lymphadenopathy:    He has no cervical adenopathy.  Neurological: He is alert and oriented to person, place, and time. He has normal strength. No cranial nerve deficit or sensory deficit. He exhibits normal muscle tone.  Skin: Skin is warm, dry and intact. No petechiae and no rash noted. He is not diaphoretic. No erythema. No pallor.  Psychiatric: He has a normal mood and affect. His behavior is normal. Judgment normal.  Nursing note and vitals reviewed.   ED Course  Procedures (including critical care time) Labs Review Labs Reviewed  CBC WITH DIFFERENTIAL/PLATELET - Abnormal; Notable for the following:    Platelets 131 (*)    Neutrophils Relative % 81 (*)    Lymphs Abs 0.6 (*)    All other components within normal limits  COMPREHENSIVE METABOLIC PANEL - Abnormal; Notable for the following:    Sodium 134 (*)    Chloride 98 (*)    Glucose, Bld 242 (*)    Calcium 8.6 (*)    All other components within normal limits  LIPASE, BLOOD - Abnormal; Notable for the following:    Lipase 16 (*)    All other components within normal limits  MAGNESIUM - Abnormal; Notable for the following:    Magnesium 1.6 (*)     All other components within normal limits    Imaging Review No results found.   EKG Interpretation None      MDM   Final diagnoses:  None   patient presents emergency department for nausea vomiting diarrhea for several days. I believe this is likely related to his recent doxycycline use causing stomach irritation. He was given Reglan, ranitidine, Maalox in the emergency department for symptom port of care. Food poisoning is also possibility however suspect it would have resolved by now if it were due to the [employer]. We'll obtain laboratory studies to assess for dehydration. Patient will be advised to continue Bactrim for his presumed MRSA coverage, and see his primary care physician within 3 days. Doxycycline will be stopped.  1610 I was informed by Gabriel Rung RN that the patient has eloped from the ED.  This was prior to my repeat evaluation or education regarding changing antibiotics, prior to completion of his work up, and prior to receiving Dc instructions.  Patient has decision making capacity but certainly is at risk for gastric ulcer if he continues doxycycline. Magnesium was also low, but patient left prior to replacement.  Tomasita Crumble, MD 06/09/14 0530

## 2014-06-09 NOTE — Discharge Instructions (Signed)
Gastritis, Adult Eddie Rasmussen, your symptoms are liekly from doxycycline.  Stop taking this medication and take bactrim for your infection.  See your primary doctor within 3 days for close follow up. If symptoms worsen, come back to the ED immediately. Thank you. Gastritis is soreness and puffiness (inflammation) of the lining of the stomach. If you do not get help, gastritis can cause bleeding and sores (ulcers) in the stomach. HOME CARE   Only take medicine as told by your doctor.  If you were given antibiotic medicines, take them as told. Finish the medicines even if you start to feel better.  Drink enough fluids to keep your pee (urine) clear or pale yellow.  Avoid foods and drinks that make your problems worse. Foods you may want to avoid include:  Caffeine or alcohol.  Chocolate.  Mint.  Garlic and onions.  Spicy foods.  Citrus fruits, including oranges, lemons, or limes.  Food containing tomatoes, including sauce, chili, salsa, and pizza.  Fried and fatty foods.  Eat small meals throughout the day instead of large meals. GET HELP RIGHT AWAY IF:   You have black or dark red poop (stools).  You throw up (vomit) blood. It may look like coffee grounds.  You cannot keep fluids down.  Your belly (abdominal) pain gets worse.  You have a fever.  You do not feel better after 1 week.  You have any other questions or concerns. MAKE SURE YOU:   Understand these instructions.  Will watch your condition.  Will get help right away if you are not doing well or get worse. Document Released: 06/18/2007 Document Revised: 03/24/2011 Document Reviewed: 02/12/2011 [facility] Patient Information 2015 [place], [place]. This information is not intended to replace advice given to you by your health care provider. Make sure you discuss any questions you have with your health care provider.

## 2014-06-09 NOTE — ED Notes (Signed)
Per EMS, pt is reporting nausea, vomiting, diarrhea, and chills since Tuesday. Pt states that he was prescribed an antibiotic for multiple sores on his extremities by his doctor on Monday. Pt states that he hasn't been able to eat or drink since Tuesday. Pt reports that his symptoms increase when laying down. CBG: 248

## 2015-04-24 ENCOUNTER — Encounter: Payer: Self-pay | Admitting: Physical Medicine & Rehabilitation

## 2016-01-01 ENCOUNTER — Other Ambulatory Visit: Payer: Self-pay | Admitting: Internal Medicine

## 2016-01-01 ENCOUNTER — Ambulatory Visit
Admission: RE | Admit: 2016-01-01 | Discharge: 2016-01-01 | Disposition: A | Discharge status: Home / Self Care | Payer: Medicare Other | Source: Ambulatory Visit | Attending: Internal Medicine | Admitting: Internal Medicine

## 2016-01-01 DIAGNOSIS — L97519 Non-pressure chronic ulcer of other part of right foot with unspecified severity: Secondary | ICD-10-CM

## 2016-01-09 ENCOUNTER — Encounter (HOSPITAL_BASED_OUTPATIENT_CLINIC_OR_DEPARTMENT_OTHER): Payer: Medicare Other | Attending: Surgery

## 2016-01-09 ENCOUNTER — Encounter (HOSPITAL_BASED_OUTPATIENT_CLINIC_OR_DEPARTMENT_OTHER): Payer: Self-pay

## 2016-01-09 DIAGNOSIS — L97512 Non-pressure chronic ulcer of other part of right foot with fat layer exposed: Secondary | ICD-10-CM | POA: Diagnosis not present

## 2016-01-09 DIAGNOSIS — I10 Essential (primary) hypertension: Secondary | ICD-10-CM | POA: Diagnosis not present

## 2016-01-09 DIAGNOSIS — F17210 Nicotine dependence, cigarettes, uncomplicated: Secondary | ICD-10-CM | POA: Insufficient documentation

## 2016-01-09 DIAGNOSIS — E11621 Type 2 diabetes mellitus with foot ulcer: Secondary | ICD-10-CM | POA: Insufficient documentation

## 2016-01-09 DIAGNOSIS — E1140 Type 2 diabetes mellitus with diabetic neuropathy, unspecified: Secondary | ICD-10-CM | POA: Diagnosis not present

## 2016-01-09 DIAGNOSIS — E1151 Type 2 diabetes mellitus with diabetic peripheral angiopathy without gangrene: Secondary | ICD-10-CM | POA: Insufficient documentation

## 2016-01-09 DIAGNOSIS — Z89411 Acquired absence of right great toe: Secondary | ICD-10-CM | POA: Diagnosis not present

## 2016-01-09 DIAGNOSIS — Z89412 Acquired absence of left great toe: Secondary | ICD-10-CM | POA: Insufficient documentation

## 2016-01-16 ENCOUNTER — Encounter (HOSPITAL_BASED_OUTPATIENT_CLINIC_OR_DEPARTMENT_OTHER): Payer: Medicare Other | Attending: Surgery

## 2016-09-30 ENCOUNTER — Encounter (INDEPENDENT_AMBULATORY_CARE_PROVIDER_SITE_OTHER): Payer: Self-pay

## 2017-07-24 IMAGING — CR DG FOOT COMPLETE 3+V*R*
3 series · 3 of 3 positions shown · non-contrast
Comparison: 05/25/2013

CLINICAL DATA: Foot pain for several years, increasing over recent
past

EXAM:
RIGHT FOOT COMPLETE - 3+ VIEW

[x foot ap right]
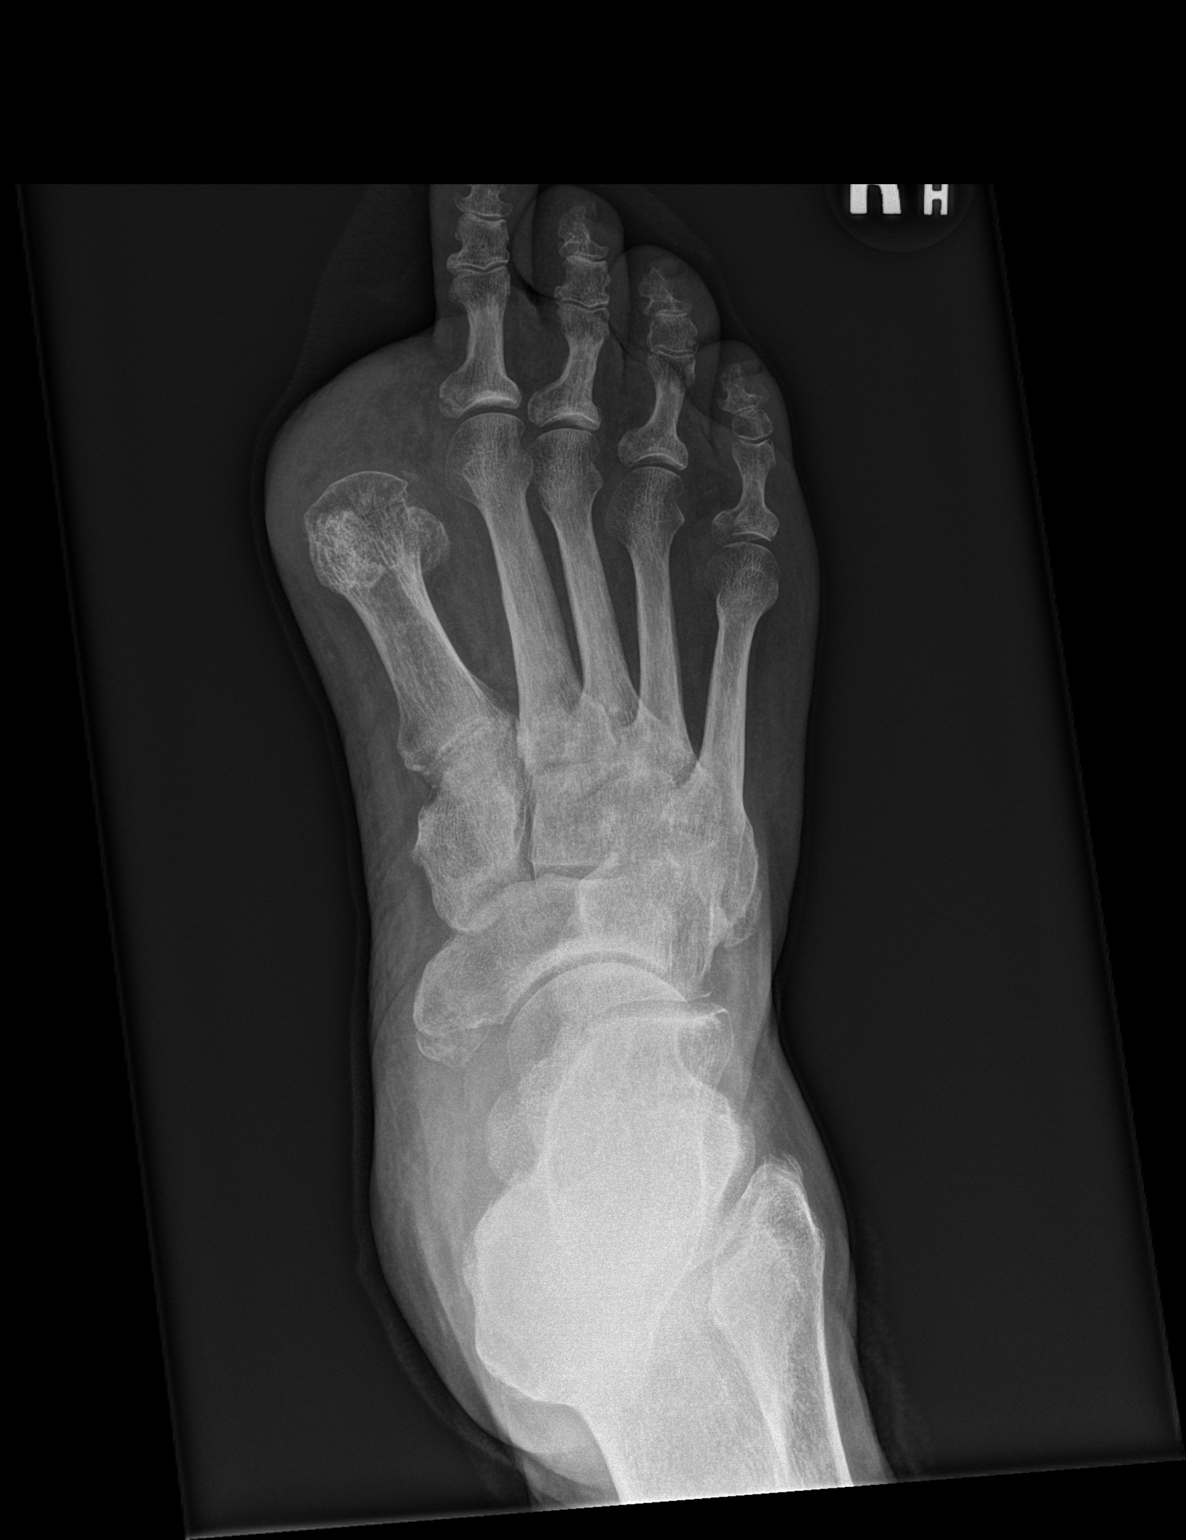

[x foot obl right]
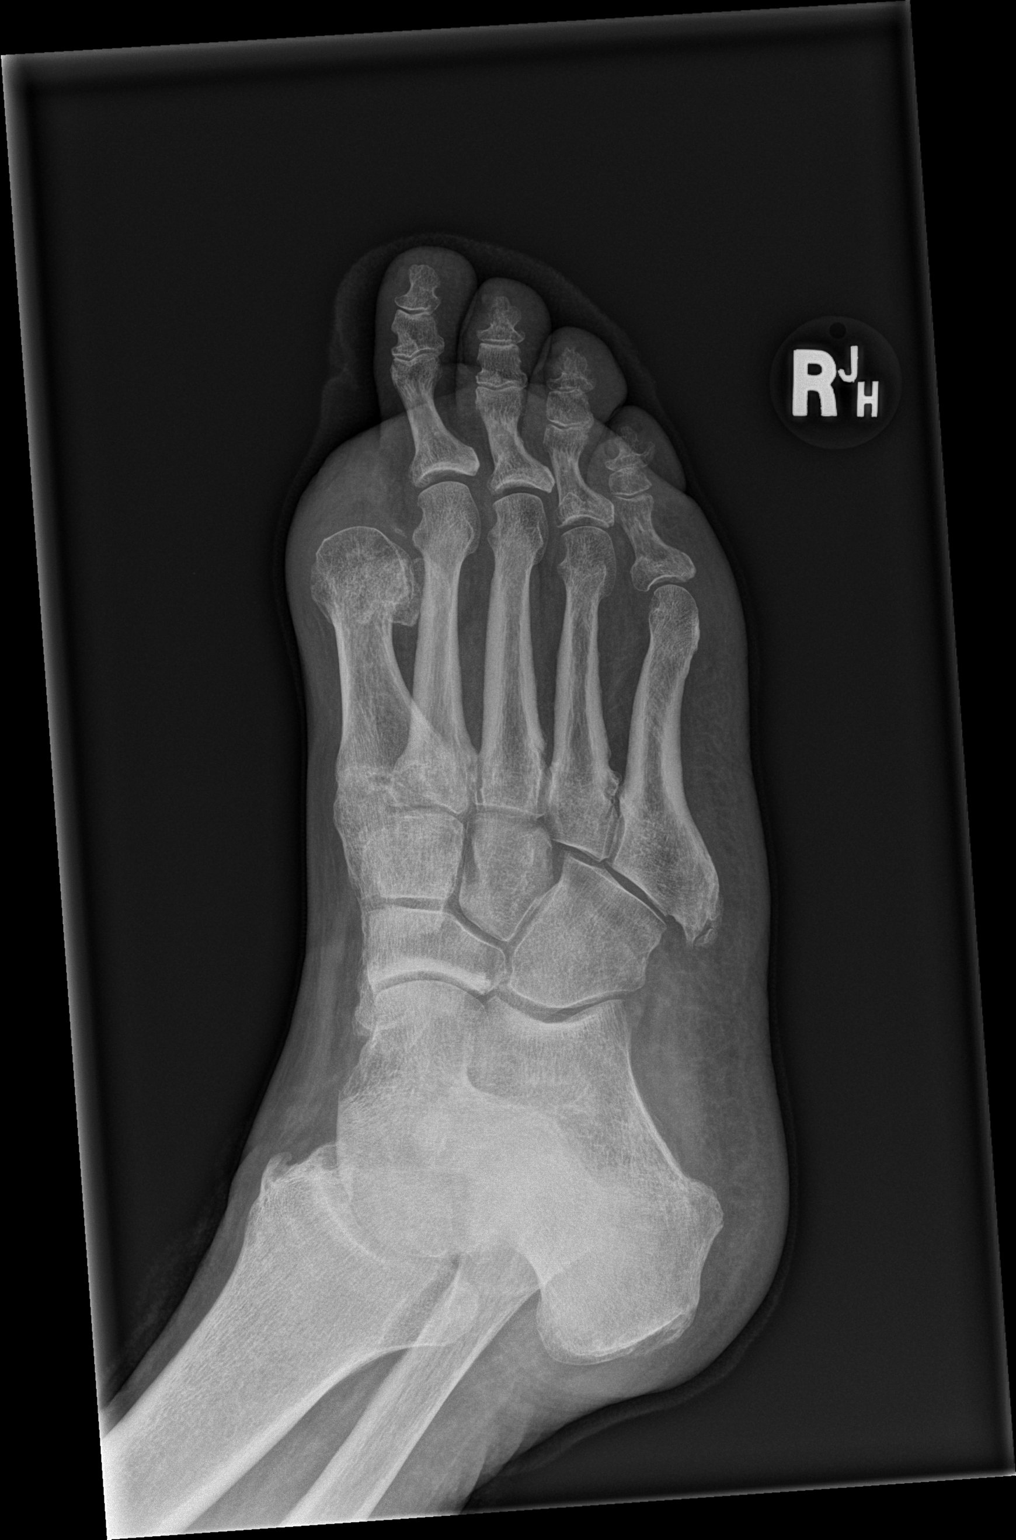

[x foot lat right]
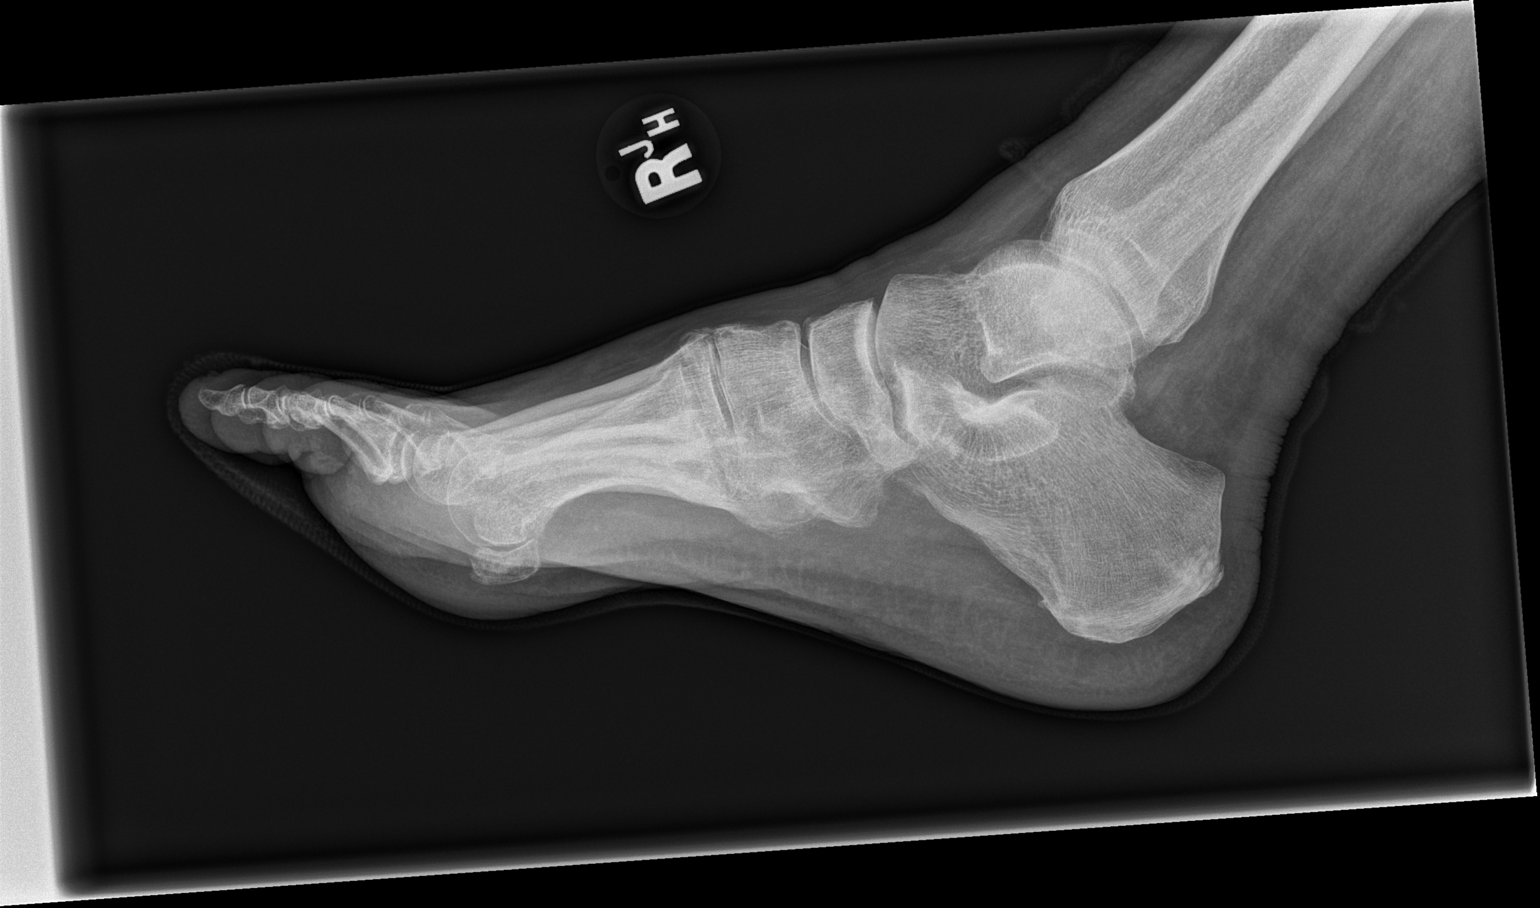

[3 of 3 positions shown; findings below may reference images not displayed]

FINDINGS: Prior amputation of the first toe is noted. Mottled soft tissue is
seen suggestive of cellulitis. No definitive bony erosion to suggest
osteomyelitis is seen. No acute fracture is noted. Some degenerative
changes of the tarsal bones are seen.
IMPRESSION: Soft tissue change consistent with the given clinical history. No
definitive osteomyelitis is seen.

Postsurgical changes.

No fracture.

## 2017-11-18 ENCOUNTER — Ambulatory Visit (INDEPENDENT_AMBULATORY_CARE_PROVIDER_SITE_OTHER): Payer: Medicare Other | Admitting: Physician Assistant

## 2017-11-20 ENCOUNTER — Encounter (INDEPENDENT_AMBULATORY_CARE_PROVIDER_SITE_OTHER): Payer: Self-pay | Admitting: Physician Assistant

## 2017-11-20 ENCOUNTER — Ambulatory Visit (INDEPENDENT_AMBULATORY_CARE_PROVIDER_SITE_OTHER): Payer: Medicare Other | Admitting: Physician Assistant

## 2017-11-20 VITALS — Ht 74.0 in | Wt 247.0 lb

## 2017-11-20 DIAGNOSIS — Z720 Tobacco use: Secondary | ICD-10-CM

## 2017-11-20 DIAGNOSIS — I739 Peripheral vascular disease, unspecified: Secondary | ICD-10-CM | POA: Diagnosis not present

## 2017-11-20 DIAGNOSIS — L97511 Non-pressure chronic ulcer of other part of right foot limited to breakdown of skin: Secondary | ICD-10-CM

## 2017-11-20 DIAGNOSIS — E1142 Type 2 diabetes mellitus with diabetic polyneuropathy: Secondary | ICD-10-CM

## 2017-11-20 NOTE — Progress Notes (Signed)
Office Visit Note   Patient: Eddie Rasmussen           Date of Birth: [birth year]           MRN: [id number] Visit Date: 11/20/2017              Requested by: Jarome Matin, MD [address] [place], [place] [address] PCP: Jarome Matin, MD  Chief Complaint  Patient presents with  . Right Foot - Wound Check    1st Ray Amp of right foot w/ulcer      HPI: The patient is a 62 yo male here for evaluation of an ulcer of the right foot 1st metatarsal head. He was referred by [facility] for further evaluation of the ulcer as it has not responded well to 1 course of antibiotics. He is currently using Bactroban to the area after cleaning and taking Bactrim DS BID. He had a previous right great toe amputation 02/2014 by Dr. Magnus Ivan for osteomyelitis. He is currently not on insulin and blood sugar is well controlled per PCP on Metformin alone. He is a current every day cigarette smoker. He reports he is the primary caregiver for his wife who is disabled and he has probably been on his foot too much. He has been wearing an old post op shoe that he had at home.     Assessment & Plan: Visit Diagnoses:  1. Non-pressure chronic ulcer of other part of right foot limited to breakdown of skin (HCC)   2. Type 2 diabetes mellitus with diabetic polyneuropathy, without long-term current use of insulin (HCC)   3. PVD (peripheral vascular disease) (HCC)   4. Tobacco abuse     Plan: After informed consent the callus and skin and soft tissue were debrided from the ulcer area with a #10 blade knife and the patient tolerated this well. He will continue Bactrim DS 1 BID and continue to wash the foot daily and use bactroban to the area daily. He was also instructed in Achilles stretches at least 5 times daily and to use a Darco shoe for ambulation. Also counseled to elevate and stay off the right foot whenever possible.   Follow-Up Instructions: Return in about 1 week (around 11/27/2017).    Ortho Exam  Patient is alert, oriented, no adenopathy, well-dressed, normal affect, normal respiratory effort. Right foot ulcer - after informed consent the ulcer of the plantar surface about the 1st MTH was debrided for callus, skin and soft tissue with a #10 Blade knife and the patient tolerated this well. The residual ulcer is limited to skin breakdown currently and ~ 5 x 8 mm. There is minimal edema and no peri wound irritation or erythema today. Palpable dorsalis pedis and posterior tibialis pulses.   Imaging: No results found. No images are attached to the encounter.  Labs: Lab Results  Component Value Date   HGBA1C 7.8 (H) 02/08/2014   HGBA1C 8.8 (H) 05/25/2013     Lab Results  Component Value Date   ALBUMIN 3.9 06/09/2014   ALBUMIN 4.1 02/08/2014   ALBUMIN 3.1 (L) 05/29/2013    Body mass index is 31.71 kg/m.  Orders:  No orders of the defined types were placed in this encounter.  No orders of the defined types were placed in this encounter.    Procedures: No procedures performed  Clinical Data: No additional findings.  ROS:  All other systems negative, except as noted in the HPI. Review of Systems  Objective: Vital Signs: Ht  6\' 2"  (1.88 m)   Wt 247 lb (112 kg)   BMI 31.71 kg/m   Specialty Comments:  No specialty comments available.  PMFS History: Patient Active Problem List   Diagnosis Date Noted  . Ulcer of right great toe due to diabetes mellitus (HCC) 02/14/2014  . Foot ulcer, left (HCC) 05/25/2013    Class: Acute  . Type II or unspecified type diabetes mellitus with neurological manifestations, not stated as uncontrolled(250.60) 05/25/2013    Class: Chronic  . PVD (peripheral vascular disease) (HCC) 05/25/2013    Class: Chronic  . Tobacco abuse 05/25/2013    Class: Chronic  . Depression 05/25/2013    Class: Chronic  . Other and unspecified hyperlipidemia 05/25/2013    Class: Chronic  . Abscess of foot including toes 05/25/2013  .  Hyperlipidemia    Past Medical History:  Diagnosis Date  . Anxiety    takes Xanax daily  . Arthritis    "all my joints" (05/25/2013)  . Bell's palsy ~ 2000   hx of  . Chronic back pain    stenosis  . Chronic lower back pain   . COPD (chronic obstructive pulmonary disease) (HCC)   . Depression    takes Zoloft daily  . Diabetes mellitus without complication (HCC)    takes Humulin R ,Humulin N,and Metformin daily  . Diabetic foot ulcer (HCC) admitted 05/25/2013   left  . History of bronchitis    10+yrs ago  . History of shingles   . Hypertension    was taken off of Verapamil over a yr ago  . Insomnia    takes Elavil nightly  . Pancreatitis    hx of  . Peripheral neuropathy   . Peripheral neuropathy   . Peripheral vascular disease (HCC)   . Pneumonia    hx of-10+yrs ago  . Weak urinary stream    seeing Medical Md this afternoon about this    History reviewed. No pertinent family history.  Past Surgical History:  Procedure Laterality Date  . AMPUTATION Left 05/28/2013   Procedure: AMPUTATION DIGIT; great toe;  Surgeon: Kathryne Hitch, MD;  Location[facility];  Service: Orthopedics;  Laterality: Left;  would like 0730 start  . AMPUTATION Right 02/14/2014   Procedure: AMPUTATION RIGHT GREAT TOE;  Surgeon: Kathryne Hitch, MD;  Location: [facility];  Service: Orthopedics;  Laterality: Right;  . BACK SURGERY    . CATARACT EXTRACTION W/ INTRAOCULAR LENS IMPLANT Right ?2007  . CHOLECYSTECTOMY    . ESOPHAGOGASTRODUODENOSCOPY    . KNEE ARTHROSCOPY Right 09/2002; 12/2002  . LUMBAR DISC SURGERY N/A 06/2003   "L4-5"  . MICRODISCECTOMY LUMBAR Left 08/2003  . MULTIPLE TOOTH EXTRACTIONS     "took most of my teeth out"  . TONSILLECTOMY     Social History   Occupational History  . Not on file  Tobacco Use  . Smoking status: Current Every Day Smoker    Packs/day: 2.00    Years: 42.00    Pack years: 84.00    Types: Cigarettes  . Smokeless tobacco: Never Used  Substance and  Sexual Activity  . Alcohol use: Yes    Comment: "stopped drinking ~ 2000"  . Drug use: No  . Sexual activity: Never

## 2017-11-27 ENCOUNTER — Ambulatory Visit (INDEPENDENT_AMBULATORY_CARE_PROVIDER_SITE_OTHER): Payer: Medicare Other | Admitting: Physician Assistant

## 2018-02-13 DEATH — deceased
